# Patient Record
Sex: Male | Born: 1985
Health system: Southern US, Community
[De-identification: ages and names within clinical notes are randomized; demographics above are authoritative.]

## PROBLEM LIST (undated history)

## (undated) DIAGNOSIS — K219 Gastro-esophageal reflux disease without esophagitis: Secondary | ICD-10-CM

## (undated) DIAGNOSIS — A159 Respiratory tuberculosis unspecified: Secondary | ICD-10-CM

---

## 2007-12-22 HISTORY — PX: APPENDECTOMY: SHX54

## 2008-01-12 ENCOUNTER — Emergency Department (HOSPITAL_COMMUNITY): Admission: EM | Admit: 2008-01-12 | Discharge: 2008-01-13 | Payer: Self-pay | Admitting: Emergency Medicine

## 2008-01-13 ENCOUNTER — Encounter (INDEPENDENT_AMBULATORY_CARE_PROVIDER_SITE_OTHER): Payer: Self-pay | Admitting: Surgery

## 2008-01-13 ENCOUNTER — Observation Stay (HOSPITAL_COMMUNITY): Admission: EM | Admit: 2008-01-13 | Discharge: 2008-01-14 | Payer: Self-pay | Admitting: Emergency Medicine

## 2008-09-07 IMAGING — US US SCROTUM
1 series · 14 of 25 positions shown · non-contrast
Comparison: None.

CLINICAL DATA: 21-year-old, scrotal pain.
 SCROTAL ULTRASOUND:

 DOPPLER ULTRASOUND OF THE TESTICLES:
TECHNIQUE: Complete ultrasound examination of the testicles, epididymis, and other scrotal structures was performed.  Color and spectral Doppler ultrasound were also utilized to evaluate blood flow to the testicles.

[Series 1: unknown · 0.07mm/px · 14 of 53 slices shown]
[im 1/53]
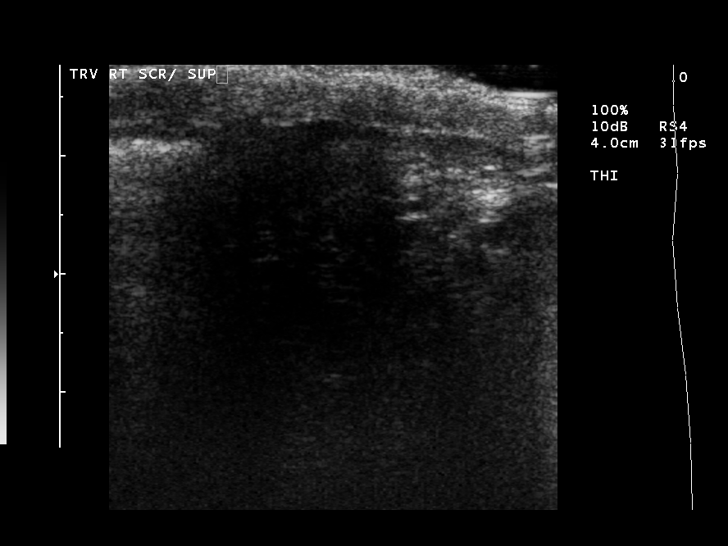
[im 5/53]
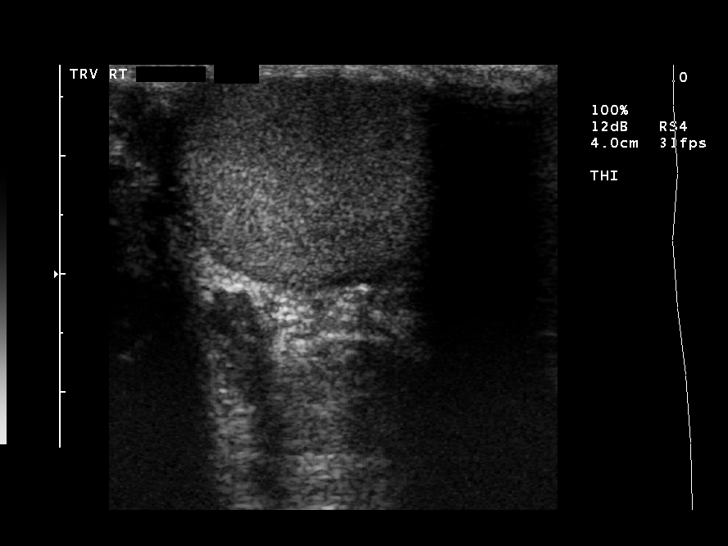
[im 9/53]
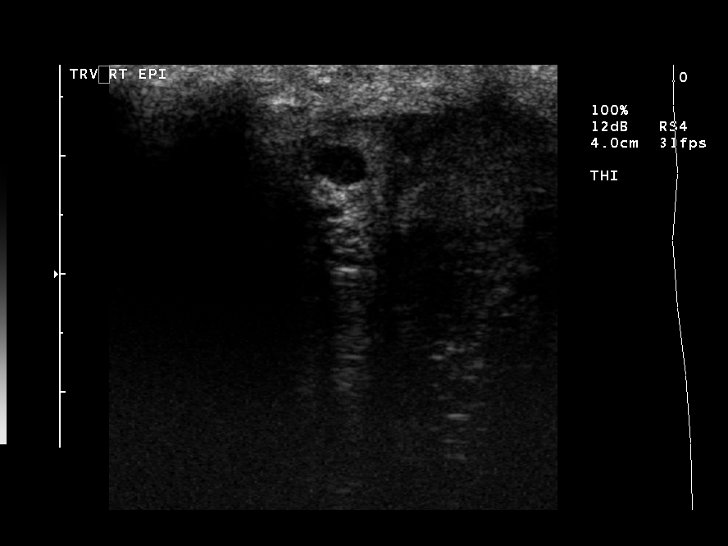
[im 14/53]
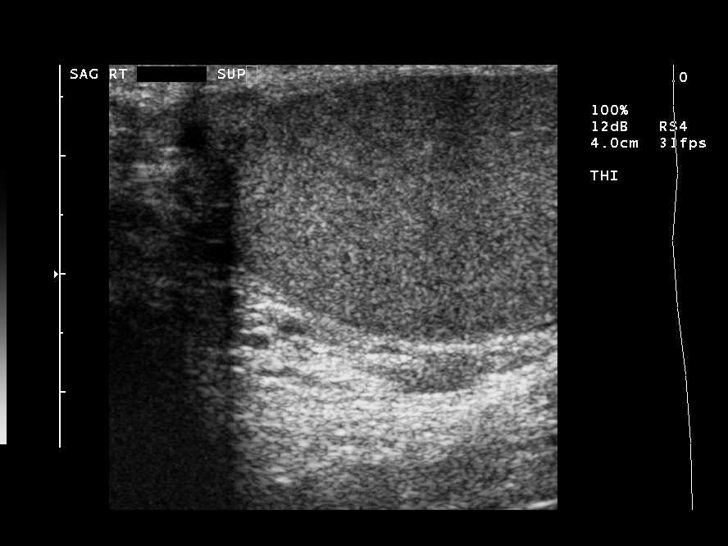
[im 18/53]
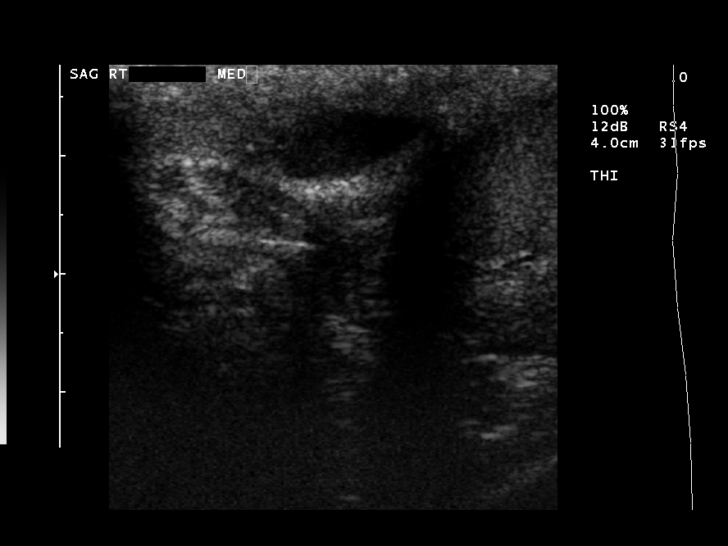
[im 20/53]
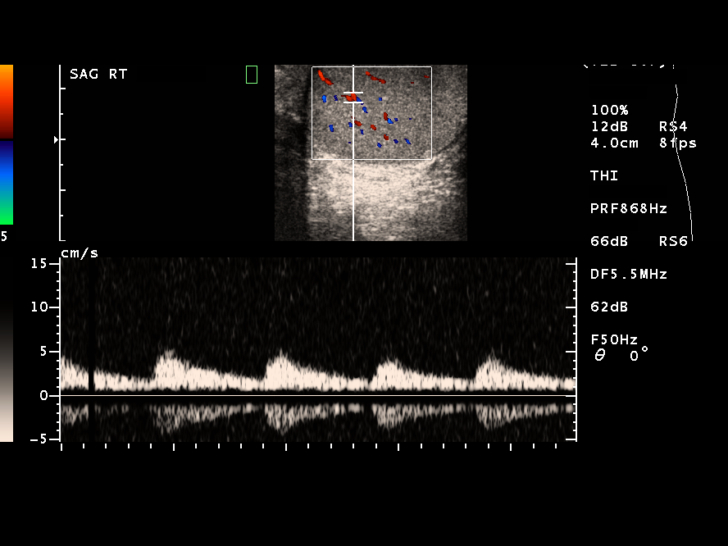
[im 24/53]
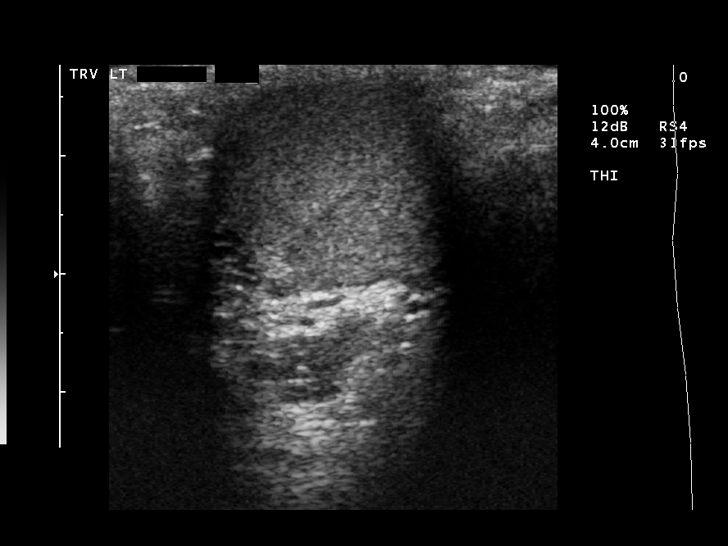
[im 29/53]
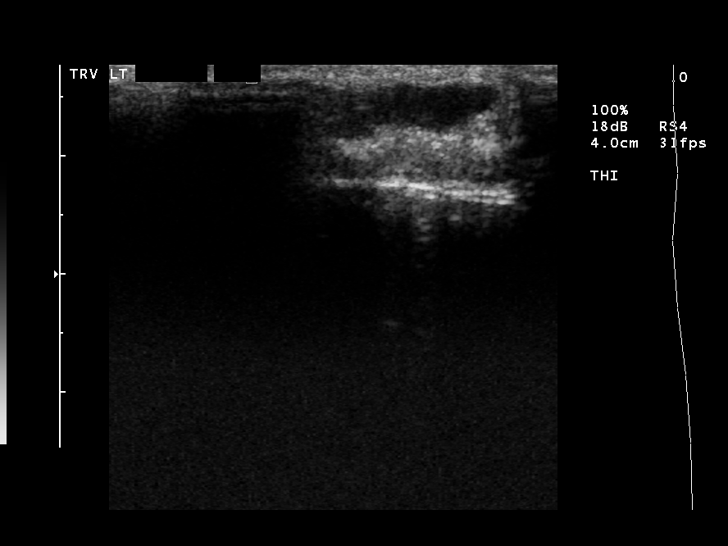
[im 33/53]
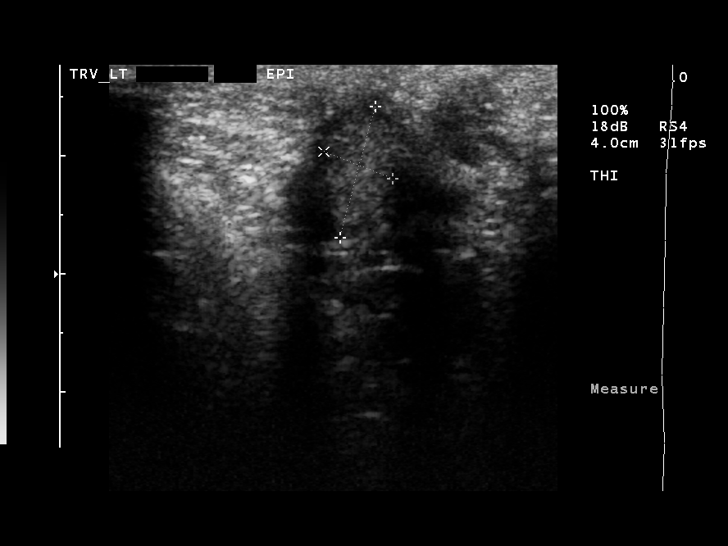
[im 35/53]
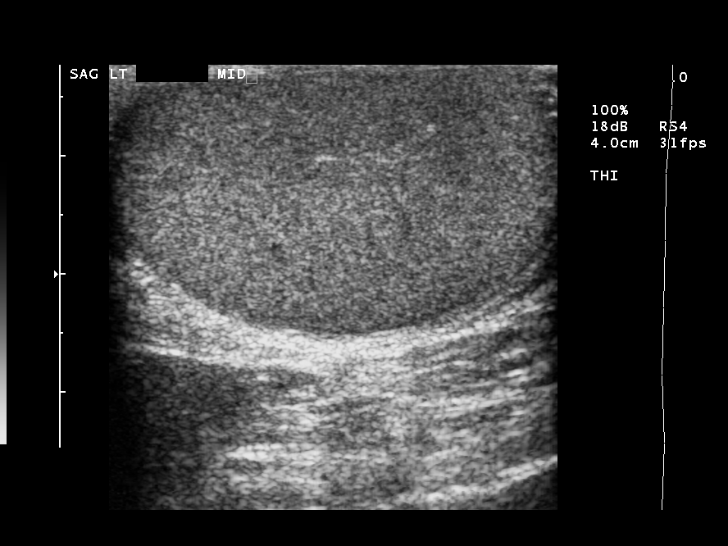
[im 40/53]
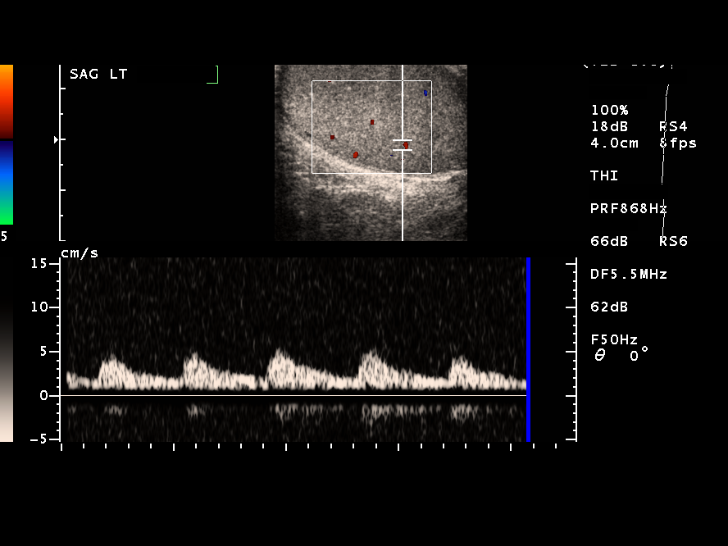
[im 44/53]
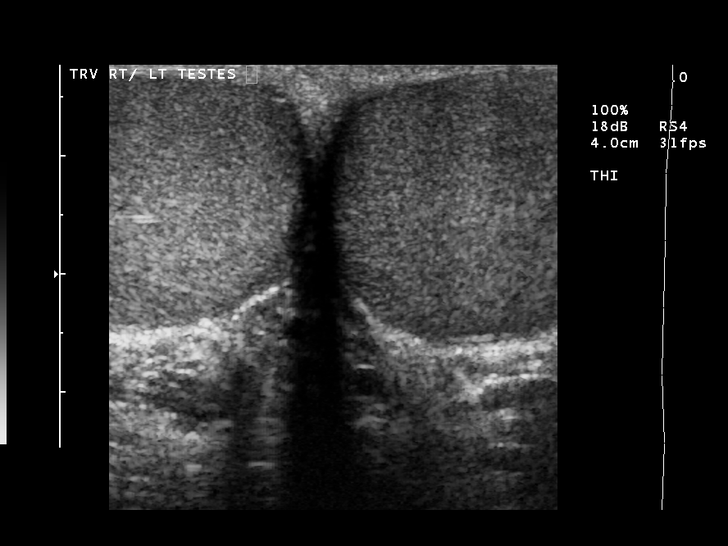
[im 48/53]
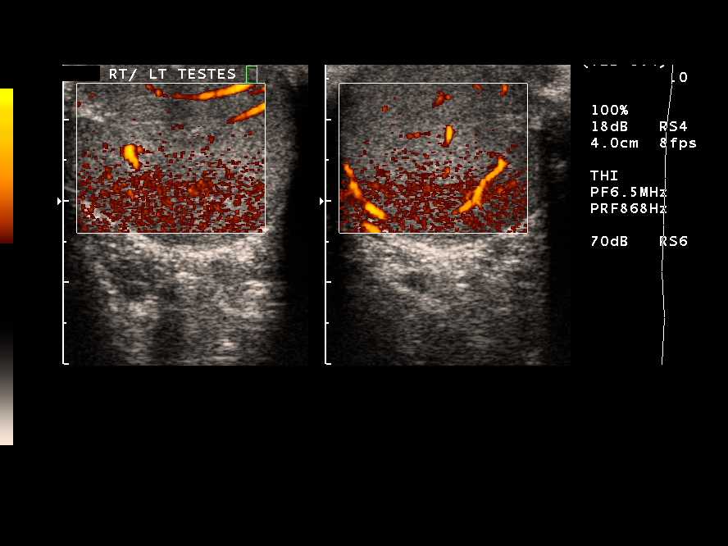
[im 53/53]
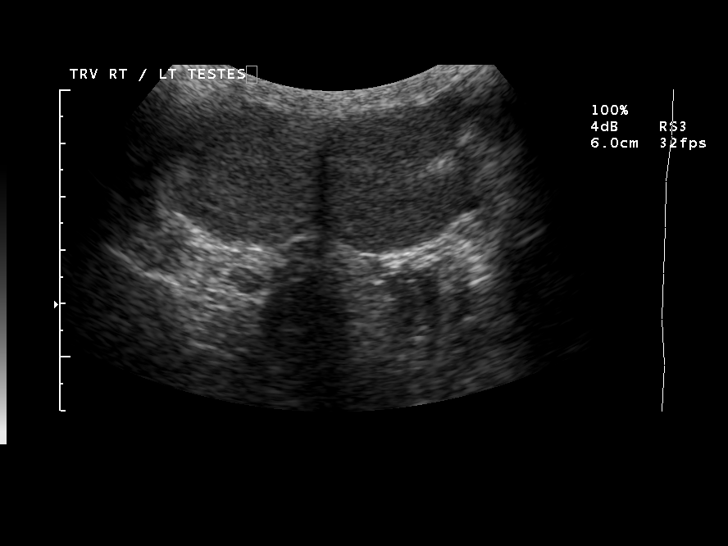

[14 of 25 positions shown; findings below may reference images not displayed]

FINDINGS: Right testicle measures 4.7 x 2.5 x 2.4 cm.  Left testicle measures 4.3 x 2.7 x 2.7 cm.  Both testicles demonstrate normal echogenicity without focal masses.  There is patent intratesticular blood flow bilaterally.  Arterial waveforms are documented.  
 Small bilateral hydroceles.  Small right epididymal cyst is noted.  No varicocele.
IMPRESSION: Unremarkable scrotal ultrasound examination.

## 2009-12-21 DIAGNOSIS — A159 Respiratory tuberculosis unspecified: Secondary | ICD-10-CM

## 2009-12-21 HISTORY — DX: Respiratory tuberculosis unspecified: A15.9

## 2010-03-14 ENCOUNTER — Encounter: Admission: RE | Admit: 2010-03-14 | Discharge: 2010-03-14 | Payer: Self-pay | Admitting: Infectious Diseases

## 2010-12-26 ENCOUNTER — Emergency Department (HOSPITAL_COMMUNITY)
Admission: EM | Admit: 2010-12-26 | Discharge: 2010-12-26 | Payer: Self-pay | Source: Home / Self Care | Admitting: Family Medicine

## 2010-12-26 LAB — POCT URINALYSIS DIPSTICK
Bilirubin Urine: NEGATIVE
Nitrite: NEGATIVE
Protein, ur: 30 mg/dL — AB
Specific Gravity, Urine: 1.015 (ref 1.005–1.030)
Urine Glucose, Fasting: NEGATIVE mg/dL
Urobilinogen, UA: 1 mg/dL (ref 0.0–1.0)
pH: 7 (ref 5.0–8.0)

## 2010-12-26 LAB — CBC
HCT: 46 % (ref 39.0–52.0)
Hemoglobin: 15.4 g/dL (ref 13.0–17.0)
MCH: 29.6 pg (ref 26.0–34.0)
MCHC: 33.5 g/dL (ref 30.0–36.0)
MCV: 88.5 fL (ref 78.0–100.0)
Platelets: 265 10*3/uL (ref 150–400)
RBC: 5.2 MIL/uL (ref 4.22–5.81)
RDW: 13.7 % (ref 11.5–15.5)
WBC: 20.5 10*3/uL — ABNORMAL HIGH (ref 4.0–10.5)

## 2010-12-26 LAB — DIFFERENTIAL
Basophils Absolute: 0 10*3/uL (ref 0.0–0.1)
Basophils Relative: 0 % (ref 0–1)
Eosinophils Absolute: 0 10*3/uL (ref 0.0–0.7)
Eosinophils Relative: 0 % (ref 0–5)
Lymphocytes Relative: 6 % — ABNORMAL LOW (ref 12–46)
Lymphs Abs: 1.2 10*3/uL (ref 0.7–4.0)
Monocytes Absolute: 1.9 10*3/uL — ABNORMAL HIGH (ref 0.1–1.0)
Monocytes Relative: 9 % (ref 3–12)
Neutro Abs: 17.4 10*3/uL — ABNORMAL HIGH (ref 1.7–7.7)
Neutrophils Relative %: 85 % — ABNORMAL HIGH (ref 43–77)

## 2010-12-26 LAB — POCT RAPID STREP A (OFFICE): Streptococcus, Group A Screen (Direct): POSITIVE — AB

## 2010-12-28 ENCOUNTER — Emergency Department (HOSPITAL_COMMUNITY)
Admission: EM | Admit: 2010-12-28 | Discharge: 2010-12-28 | Payer: Self-pay | Source: Home / Self Care | Admitting: Family Medicine

## 2011-02-02 ENCOUNTER — Inpatient Hospital Stay (INDEPENDENT_AMBULATORY_CARE_PROVIDER_SITE_OTHER)
Admission: RE | Admit: 2011-02-02 | Discharge: 2011-02-02 | Disposition: A | Discharge status: Home / Self Care | Payer: Self-pay | Source: Ambulatory Visit | Attending: Family Medicine | Admitting: Family Medicine

## 2011-02-02 ENCOUNTER — Emergency Department (HOSPITAL_COMMUNITY)
Admission: EM | Admit: 2011-02-02 | Discharge: 2011-02-02 | Disposition: A | Discharge status: Home / Self Care | Payer: Self-pay | Attending: Emergency Medicine | Admitting: Emergency Medicine

## 2011-02-02 ENCOUNTER — Emergency Department (HOSPITAL_COMMUNITY): Payer: Self-pay

## 2011-02-02 DIAGNOSIS — N509 Disorder of male genital organs, unspecified: Secondary | ICD-10-CM | POA: Insufficient documentation

## 2011-02-02 DIAGNOSIS — N453 Epididymo-orchitis: Secondary | ICD-10-CM | POA: Insufficient documentation

## 2011-02-03 LAB — GC/CHLAMYDIA PROBE AMP, GENITAL
Chlamydia, DNA Probe: NEGATIVE
GC Probe Amp, Genital: NEGATIVE

## 2011-05-05 NOTE — Op Note (Signed)
George Nguyen, George Nguyen               ACCOUNT NO.:  [id number]   MEDICAL RECORD NO.:  [id number]          PATIENT TYPE:  INP   LOCATION:  2550                         FACILITY:  [facility]   PHYSICIAN:  Wilmon Arms. Corliss Skains, M.D. DATE OF BIRTH:  01/10/1986   DATE OF PROCEDURE:  01/13/2008  DATE OF DISCHARGE:                               OPERATIVE REPORT   PREOPERATIVE DIAGNOSIS:  Acute appendicitis   POSTOPERATIVE DIAGNOSIS:  Acute on chronic appendicitis.   PROCEDURE:  Laparoscopic appendectomy.   SURGEON:  Wilmon Arms. Corliss Skains, M.D., FACS   ANESTHESIA:  General.   INDICATIONS:  The patient is a 25 year old male who presents with a 2-  day history of severe right lower quadrant pain.  He has also had some  hematuria.  Workup in the emergency department showed that the patient  has a 2 mm right ureteral stone with some mild hydronephrosis and  hydroureter.  However the patient also has a thickened appendix with a  large appendicolith.  The patient was initially sent home with  outpatient management of his ureteral stone. However, he returned to the  emergency department as the pain became more severe in his right lower  quadrant.  He has also had some nausea and vomiting.  The decision was  made to proceed with an appendectomy and urology has been consulted and  they are choosing to manage this stone nonoperatively for now.   DESCRIPTION OF PROCEDURE:  The patient is brought to the operating room,  placed in the supine position on the operating room table.  After an  adequate level of general anesthesia was obtained, the Foley catheter  was placed under sterile technique.  The patient's abdomen was shaved,  prepped with Betadine and draped in sterile fashion.  A time-out was  taken to assure the proper patient and proper procedure.  The area below  his umbilicus was infiltrated with 0.25% Marcaine.  A transverse  incision was made and dissection was carried down to the fascia.  The  fascia  was opened vertically.  The peritoneal cavity was bluntly  entered.  A stay suture of #0 Vicryl as placed around the fascial  opening.  The Hasson cannula was inserted and secured with a stay  suture.  Pneumoperitoneum was obtained by insufflating CO2 maintaining  maximal pressure of 15 mmHg.  The laparoscope was then inserted and  the  patient was positioned in Trendelenburg tilted to his left.  A 5-mm port  was placed in the right upper quadrant, another 5 mm port in the left  lower quadrant.  The scope was changed to a 5 mm 30 degree scope.  We  use Glassman clamps to mobilize the cecum medially.  There were a lot of  chronic adhesions in the right lower quadrant.  These were slowly taken  down with scissors and cautery.  The cecum was then mobilized medially.  The very tip of a thickened but non inflamed appendix was identified.  This was grasped.  It then became obvious that the appendix was quite  long but had a lot of retroperitoneal adhesions.  These were then slowly  taken down with blunt dissection as well as the harmonic scalpel.  We  continue mobilizing the appendix.  The mesoappendix was divided with the  harmonic scalpel.  Once we had identified the base of the appendix where  it joined the cecum, this was then divided with an Endo-GIA stapler.  The appendix was then placed in an EndoCatch sac and removed through the  umbilical port site.  We inspected the staple line and no leak was  noted.  No bleeding was noted.  We thoroughly irrigated the right lower  quadrant and suctioned it as dry as possible.  Pneumoperitoneum was then  released. Trocars were removed.  The stay suture was used to close the  fascia.  Deep 4-0 Monocryl was used to close the dermis.  Dermabond was  used for the skin.  The patient was then extubated and brought to  recovery in stable condition.  All sponge, instrument, and needle counts  were correct.      Wilmon Arms. Tsuei, M.D.  Electronically  Signed     MKT/MEDQ  D:  01/13/2008  T:  01/13/2008  Job:  [id number]

## 2011-05-05 NOTE — Discharge Summary (Signed)
NAMECADEL, STAIRS               ACCOUNT NO.:  [id number]   MEDICAL RECORD NO.:  [id number]          PATIENT TYPE:  OBV   LOCATION:  5120                         FACILITY:  [facility]   PHYSICIAN:  Wilmon Arms. Corliss Skains, M.D. DATE OF BIRTH:  [birth year]   DATE OF ADMISSION:  01/13/2008  DATE OF DISCHARGE:  01/14/2008                               DISCHARGE SUMMARY   ADMISSION DIAGNOSES:  1. Right obstructing ureteral calculus.  2. Nonobstructing left renal calculus.  3. Acute on chronic appendicitis.   DISCHARGE DIAGNOSES:  Same.   PROCEDURE:  Laparoscopic appendectomy.   HISTORY OF PRESENT ILLNESS:  The patient is a 25 year old male who  presents with a 2-day history of a worsening right lower quadrant and  right flank pain.  He underwent workup in the emergency department which  showed a minimally elevated white count.  He was noted to have a 2 mm  right ureteral stone with some mild hydronephrosis and hydroureter.  He  has also had some hematuria.  His appendix appeared thickened and had a  large appendicolith.  He was initially discharged home with outpatient  treatment for his ureteral stone.  However, his pain worsened and he  returned.  Surgery was then consulted regarding his appendix.   HOSPITAL COURSE:  The patient was admitted to the hospital and was  brought to the operating room where he underwent a laparoscopic  appendectomy.  The appendix of was thickened but did not appear actively  infected.  Most likely the patient has had some chronic appendicitis and  had a recurrence of his symptoms.  Today the patient feels better.  He  is very hungry and has been eating quite well.  He still has a little  bit of soreness around his incisions as well as on the right side.  His  urination has improved.  He does not see any blood.  He had some burning  after the Foley catheter was removed postop.   DISCHARGE INSTRUCTIONS:  The patient has been given a prescription for  Flomax.  He  has also been given a prescription for Percocet.  The Flomax  was given by the emergency department.  He is to follow with Dr. Ezzie Dural at [facility] in 1-2 weeks.  Follow up Dr. Corliss Skains in 3-4  weeks.  He may shower.  Regular diet.  He should try to strain his urine  to catch any ureteral stone.      Wilmon Arms. Tsuei, M.D.  Electronically Signed     MKT/MEDQ  D:  01/14/2008  T:  01/15/2008  Job:  [id number]

## 2011-05-05 NOTE — H&P (Signed)
[name], [name]               ACCOUNT NO.:  [id number]   MEDICAL RECORD NO.:  [id number]          PATIENT TYPE:  INP   LOCATION:  2550                         FACILITY:  [facility]   PHYSICIAN:  Wilmon Arms. Corliss Skains, M.D. DATE OF BIRTH:  09/07/1986   DATE OF ADMISSION:  01/13/2008  DATE OF DISCHARGE:                              HISTORY & PHYSICAL   CHIEF COMPLAINT:  Right lower quadrant pain.   HISTORY OF PRESENT ILLNESS:  The patient is a 25 year old male who  presents with a 2-day history of right lower quadrant pain radiating  down into his groin and into his testicle; the pain also radiates around  to his right flank.  He was initially evaluated last night in the  emergency department.  A testicular ultrasound was normal and ruled out  testicular torsion.  His white count was mildly elevated at 11.  A  noncontrasted CT scan was obtained due to hematuria.  The patient has  mild right hydronephrosis and hydroureter with a 2-mm pelvic ureteral  stone.  However, he also has a large appendicolith with a thickened  appendix.  On this noncontrasted CT scan, there was not really a lot of  inflammation around the appendix, but it clearly appears thickened.  There is no sign of abscess or perforation.  No free air.  The patient  was discharged home last night with outpatient treatment for his  ureteral stone.  However, after getting home, he began having more  severe right lower quadrant abdominal pain; the patient returned to the  emergency department for reevaluation.  The white count was repeated,  which is now normal.  However, due to his pain and onset of nausea and  vomiting, we were consulted regarding his appendix.   PAST MEDICAL HISTORY:  None.   PAST SURGICAL HISTORY:  None.   FAMILY HISTORY:  Diabetes in his mother.   SOCIAL HISTORY:  Nonsmoker, nondrinker.   ALLERGIES:  None.   MEDICATIONS:  He was given p.r.n. Percocet as well as Flomax, but it is  not clear whether he  is actually taking any yet, other than the  Percocet.   PHYSICAL EXAMINATION:  VITAL SIGNS:  Temperature 99.1, pulse 82,  respirations 20, blood pressure 131/81.  GENERAL: This is a well-developed, well-nourished male in no apparent  distress.  HEENT:  EOMI.  Sclerae anicteric.  NECK:  No mass, no thyromegaly.  LUNGS:  Clear.  Normal respiratory effort.  HEART:  Regular rate and rhythm.  No murmur.  ABDOMEN:  Positive bowel sounds.  Soft.  Tender in the right lower  quadrant, no real tenderness in his right groin or in his right flank.  There is some minimal guarding in the right lower quadrant.  No rebound.  EXTREMITIES:  No edema.  SKIN:  Warm and dry with no sign of jaundice.   LABORATORY DATA:  White count 9.8 and was 11.0 last night, hemoglobin  15.2.  Electrolytes within normal limits.   IMAGING STUDY:  CT scan shows a nonobstructing left renal calculus, mild  right hydronephrosis and hydroureter with a 2-mm pelvic ureteral  stone  and large appendicolith with appendiceal wall thickening.   IMPRESSION:  1. Acute on chronic appendicitis.  2. Obstructing right ureteral stone.   PLAN:  1. From a general surgical standpoint, I recommend a laparoscopic      appendectomy.  2. We will also consult Dr. Chana Bode Nesi from Urology regarding his      right ureteral stone.  Upon further discussion with the patient,      the patient states that he has had intermittent right lower      quadrant pain for the last couple of years.  He may have some      element of chronic appendicitis, but his symptoms seem worse this      time.  We discussed the procedure with the patient including      benefits and risks including bleeding and infection.  He      understands that he probably has 2 different processes going on at      the same time; his symptoms seemed to be more consistent with      appendicitis at this time, however.      Wilmon Arms. Tsuei, M.D.  Electronically Signed      MKT/MEDQ  D:  01/13/2008  T:  01/13/2008  Job:  [id number]

## 2011-06-04 ENCOUNTER — Inpatient Hospital Stay (INDEPENDENT_AMBULATORY_CARE_PROVIDER_SITE_OTHER)
Admission: RE | Admit: 2011-06-04 | Discharge: 2011-06-04 | Disposition: A | Discharge status: Home / Self Care | Payer: 59 | Source: Ambulatory Visit | Attending: Family Medicine | Admitting: Family Medicine

## 2011-06-04 ENCOUNTER — Emergency Department (HOSPITAL_COMMUNITY)
Admission: EM | Admit: 2011-06-04 | Discharge: 2011-06-04 | Disposition: A | Discharge status: Home / Self Care | Payer: 59 | Attending: Emergency Medicine | Admitting: Emergency Medicine

## 2011-06-04 ENCOUNTER — Emergency Department (HOSPITAL_COMMUNITY): Payer: 59

## 2011-06-04 DIAGNOSIS — R110 Nausea: Secondary | ICD-10-CM | POA: Insufficient documentation

## 2011-06-04 DIAGNOSIS — R109 Unspecified abdominal pain: Secondary | ICD-10-CM | POA: Insufficient documentation

## 2011-06-04 LAB — POCT I-STAT, CHEM 8
BUN: 12 mg/dL (ref 6–23)
Calcium, Ion: 1.18 mmol/L (ref 1.12–1.32)
Chloride: 103 mEq/L (ref 96–112)
Creatinine, Ser: 0.8 mg/dL (ref 0.4–1.5)
Glucose, Bld: 106 mg/dL — ABNORMAL HIGH (ref 70–99)
HCT: 50 % (ref 39.0–52.0)
Hemoglobin: 17 g/dL (ref 13.0–17.0)
Potassium: 4 mEq/L (ref 3.5–5.1)
Sodium: 140 mEq/L (ref 135–145)
TCO2: 26 mmol/L (ref 0–100)

## 2011-06-04 LAB — POCT URINALYSIS DIP (DEVICE)
Bilirubin Urine: NEGATIVE
Glucose, UA: NEGATIVE mg/dL
Hgb urine dipstick: NEGATIVE
Ketones, ur: NEGATIVE mg/dL
Leukocytes, UA: NEGATIVE
Nitrite: NEGATIVE
Protein, ur: NEGATIVE mg/dL
Specific Gravity, Urine: 1.02 (ref 1.005–1.030)
Urobilinogen, UA: 1 mg/dL (ref 0.0–1.0)
pH: 7 (ref 5.0–8.0)

## 2011-09-11 LAB — BASIC METABOLIC PANEL
BUN: 9
CO2: 28
Calcium: 9.1
Chloride: 106
Creatinine, Ser: 0.77
GFR calc Af Amer: >60
GFR calc non Af Amer: >60
Glucose, Bld: 117 — ABNORMAL HIGH
Potassium: 3.8
Sodium: 140

## 2011-09-11 LAB — CBC
HCT: 44.9
HCT: 47
Hemoglobin: 13.4
Hemoglobin: 15.2
Hemoglobin: 15.8
MCHC: 33.5
MCHC: 33.8
MCHC: 33.8
MCV: 87.5
MCV: 88.2
MCV: 88.4
Platelets: 291
Platelets: 328
RBC: 4.49
RBC: 5.14
RBC: 5.34
RDW: 13.5
RDW: 13.6
RDW: 13.7
WBC: 11 — ABNORMAL HIGH
WBC: 9.8

## 2011-09-11 LAB — I-STAT 8, (EC8 V) (CONVERTED LAB)
BUN: 14
Bicarbonate: 26 — ABNORMAL HIGH
Chloride: 107
Glucose, Bld: 111 — ABNORMAL HIGH
HCT: 52
Hemoglobin: 17.7 — ABNORMAL HIGH
Operator id: 196461
Potassium: 3.4 — ABNORMAL LOW
Sodium: 140
TCO2: 27
pCO2, Ven: 47
pH, Ven: 7.35 — ABNORMAL HIGH

## 2011-09-11 LAB — DIFFERENTIAL
Basophils Absolute: 0
Basophils Absolute: 0
Basophils Relative: 0
Basophils Relative: 0
Eosinophils Absolute: 0.1
Eosinophils Absolute: 0.2
Eosinophils Relative: 1
Eosinophils Relative: 1
Lymphocytes Relative: 16
Lymphocytes Relative: 30
Lymphs Abs: 1.5
Lymphs Abs: 3.3
Monocytes Absolute: 0.7
Monocytes Absolute: 0.8
Monocytes Relative: 7
Monocytes Relative: 7
Neutro Abs: 6.7
Neutro Abs: 7.5
Neutrophils Relative %: 61
Neutrophils Relative %: 76

## 2011-09-11 LAB — URINALYSIS, ROUTINE W REFLEX MICROSCOPIC
Bilirubin Urine: NEGATIVE
Glucose, UA: NEGATIVE
Ketones, ur: NEGATIVE
Leukocytes, UA: NEGATIVE
Nitrite: NEGATIVE
Protein, ur: 100 — AB
Specific Gravity, Urine: 1.028
Urobilinogen, UA: 1
pH: 6

## 2011-09-11 LAB — URINE MICROSCOPIC-ADD ON

## 2011-09-11 LAB — POCT I-STAT CREATININE
Creatinine, Ser: 0.9
Operator id: 196461

## 2012-05-04 ENCOUNTER — Emergency Department (HOSPITAL_COMMUNITY)
Admission: EM | Admit: 2012-05-04 | Discharge: 2012-05-04 | Disposition: A | Discharge status: Home / Self Care | Payer: 59 | Attending: Emergency Medicine | Admitting: Emergency Medicine

## 2012-05-04 ENCOUNTER — Encounter (HOSPITAL_COMMUNITY): Payer: Self-pay

## 2012-05-04 DIAGNOSIS — M549 Dorsalgia, unspecified: Secondary | ICD-10-CM | POA: Insufficient documentation

## 2012-05-04 DIAGNOSIS — X503XXA Overexertion from repetitive movements, initial encounter: Secondary | ICD-10-CM | POA: Insufficient documentation

## 2012-05-04 DIAGNOSIS — Y9269 Other specified industrial and construction area as the place of occurrence of the external cause: Secondary | ICD-10-CM | POA: Insufficient documentation

## 2012-05-04 DIAGNOSIS — S39012A Strain of muscle, fascia and tendon of lower back, initial encounter: Secondary | ICD-10-CM

## 2012-05-04 DIAGNOSIS — S335XXA Sprain of ligaments of lumbar spine, initial encounter: Secondary | ICD-10-CM | POA: Insufficient documentation

## 2012-05-04 MED ORDER — KETOROLAC TROMETHAMINE 60 MG/2ML IM SOLN
60.0000 mg | Freq: Once | INTRAMUSCULAR | Status: AC
  Administered 2012-05-04: 60 mg via INTRAMUSCULAR
  Filled 2012-05-04: qty 2

## 2012-05-04 MED ORDER — CYCLOBENZAPRINE HCL 10 MG PO TABS: 10.0000 mg | ORAL_TABLET | Freq: Three times a day (TID) | ORAL | Status: AC | PRN

## 2012-05-04 MED ORDER — HYDROCODONE-ACETAMINOPHEN 5-325 MG PO TABS: 1.0000 | ORAL_TABLET | Freq: Four times a day (QID) | ORAL | Status: AC | PRN

## 2012-05-04 MED ORDER — IBUPROFEN 800 MG PO TABS
800.0000 mg | ORAL_TABLET | Freq: Once | ORAL | Status: AC
  Administered 2012-05-04: 800 mg via ORAL
  Filled 2012-05-04: qty 1

## 2012-05-04 MED ORDER — IBUPROFEN 800 MG PO TABS: 800.0000 mg | ORAL_TABLET | Freq: Three times a day (TID) | ORAL | Status: AC | PRN

## 2012-05-04 NOTE — ED Provider Notes (Signed)
History     CSN: [id number]  Arrival date & time 05/04/12  0414   First MD Initiated Contact with Patient 05/04/12 0431      Chief Complaint  Patient presents with  . Back Pain    (Consider location/radiation/quality/duration/timing/severity/associated sxs/prior treatment) HPI Patient presents emergency Dept. with lower back pain began last night after arriving home from work.  He, states that he does do some lifting at work.  He was lifting some boxes and he thinks that may have caused this injury.  Patient states that he has not have any weakness or numbness, nausea, vomiting, abdominal pain, fever, chest pain, or shortness of breath.  Patient, states he is not having difficulty walking, or incontinence of bowel or bladder.  Patient, states that he tried ice on his lower back with some relief.  Patient, states that movement seemed to make the pain, worse. History reviewed. No pertinent past medical history.  History reviewed. No pertinent past surgical history.  History reviewed. No pertinent family history.  History  Substance Use Topics  . Smoking status: Not on file  . Smokeless tobacco: Not on file  . Alcohol Use: No      Review of Systems All other systems negative except as documented in the HPI. All pertinent positives and negatives as reviewed in the HPI.  Allergies  Review of patient's allergies indicates no known allergies.  Home Medications  No current outpatient prescriptions on file.  Pulse 70  Temp(Src) 98.1 F (36.7 C) (Oral)  Resp 18  SpO2 100%  Physical Exam Physical Examination: General appearance - alert, well appearing, and in no distress, oriented to person, place, and time and normal appearing weight Mental status - alert, oriented to person, place, and time Chest - clear to auscultation, no wheezes, rales or rhonchi, symmetric air entry Heart - normal rate, regular rhythm, normal S1, S2, no murmurs, rubs, clicks or gallops Back exam - full  range of motion,  palpable spasm or pain on motion, at this point.  Patient does not have any palpable pain is here taken ibuprofen 800 prior to my exam. Neurological - alert, oriented, normal speech, no focal findings or movement disorder noted, DTR's normal and symmetric, motor and sensory grossly normal bilaterally, normal muscle tone, no tremors, strength 5/5  ED Course  Procedures (including critical care time)  Patient will be treated for lumbar strain based on his history of present illness and physical exam findings.  He is told to return here for any worsening in his condition, advised to use ice and heat on his lower back.  Patient be given a work note for 2 days.  Patient is given the plan.  All questions were answered  MDM  See above comments        Carlyle Dolly, PA-C 05/04/12 0448

## 2012-05-04 NOTE — ED Notes (Signed)
Patient discharge via ambulatory with steady gait. Respirations equal and unlabored. Skin warm and dry. No acute distress noted. 

## 2012-05-04 NOTE — ED Notes (Signed)
Pt complains of lower back pain since last night, no injury noted, but has been picking up boxes

## 2012-05-04 NOTE — Discharge Instructions (Signed)
Use ice and heat on your lower back.  Return here for any worsening in your condition.

## 2012-05-05 NOTE — ED Provider Notes (Signed)
Medical screening examination/treatment/procedure(s) were performed by non-physician practitioner and as supervising physician I was immediately available for consultation/collaboration.  Sunnie Nielsen, MD 05/05/12 [phone]

## 2012-08-10 ENCOUNTER — Telehealth (INDEPENDENT_AMBULATORY_CARE_PROVIDER_SITE_OTHER): Payer: Self-pay | Admitting: General Surgery

## 2012-08-10 NOTE — Telephone Encounter (Signed)
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 I told pt that I needed to talk to you about this first and go from there. Pt call back number is [phone] cell

## 2012-08-26 ENCOUNTER — Ambulatory Visit (INDEPENDENT_AMBULATORY_CARE_PROVIDER_SITE_OTHER): Payer: 59 | Admitting: Surgery

## 2012-08-26 ENCOUNTER — Encounter (INDEPENDENT_AMBULATORY_CARE_PROVIDER_SITE_OTHER): Payer: Self-pay | Admitting: Surgery

## 2012-08-26 VITALS — BP 123/79 | HR 85 | Temp 98.6°F | Resp 14 | Ht 66.0 in | Wt 147.6 lb

## 2012-08-26 DIAGNOSIS — K36 Other appendicitis: Secondary | ICD-10-CM

## 2012-08-26 NOTE — Progress Notes (Signed)
Status post left back appendectomy in January of 2009. His pathology showed benign appendix. The patient did not keep his postop visit. 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 He is having no problems related to his surgery. His incisions are all well-healed no sign of infection. No sign of hernia. I gave him a letter clearing him for full activity. Followup when necessary.  Wilmon Arms. Corliss Skains, MD, [facility]  08/26/2012 11:33 AM

## 2013-01-25 ENCOUNTER — Other Ambulatory Visit: Payer: Self-pay | Admitting: Family Medicine

## 2013-01-25 ENCOUNTER — Ambulatory Visit
Admission: RE | Admit: 2013-01-25 | Discharge: 2013-01-25 | Disposition: A | Discharge status: Home / Self Care | Payer: 59 | Source: Ambulatory Visit | Attending: Family Medicine | Admitting: Family Medicine

## 2013-01-25 DIAGNOSIS — R7611 Nonspecific reaction to tuberculin skin test without active tuberculosis: Secondary | ICD-10-CM

## 2013-12-30 ENCOUNTER — Encounter (HOSPITAL_COMMUNITY): Payer: Self-pay | Admitting: Emergency Medicine

## 2013-12-30 ENCOUNTER — Emergency Department (HOSPITAL_COMMUNITY)
Admission: EM | Admit: 2013-12-30 | Discharge: 2013-12-30 | Disposition: A | Discharge status: Home / Self Care | Payer: 59 | Source: Home / Self Care | Attending: Family Medicine | Admitting: Family Medicine

## 2013-12-30 DIAGNOSIS — J329 Chronic sinusitis, unspecified: Secondary | ICD-10-CM

## 2013-12-30 MED ORDER — IPRATROPIUM BROMIDE 0.06 % NA SOLN: 2.0000 | Freq: Four times a day (QID) | NASAL | Status: DC

## 2013-12-30 MED ORDER — AZITHROMYCIN 250 MG PO TABS: 250.0000 mg | ORAL_TABLET | Freq: Every day | ORAL | Status: DC

## 2013-12-30 MED ORDER — PREDNISONE 10 MG PO TABS: 30.0000 mg | ORAL_TABLET | Freq: Every day | ORAL | Status: DC

## 2013-12-30 MED ORDER — GUAIFENESIN-CODEINE 100-10 MG/5ML PO SOLN: 5.0000 mL | Freq: Every evening | ORAL | Status: DC | PRN

## 2013-12-30 NOTE — Discharge Instructions (Signed)
Thank you for coming in today. Take prednisone daily for 5 days. Use Atrovent nasal spray. Use codeine containing cough medication at bedtime as needed. If not getting better in for 5 days or getting worse after taking azithromycin antibiotics. Call or go to the emergency room if you get worse, have trouble breathing, have chest pains, or palpitations.   Sinusitis Sinusitis is redness, soreness, and swelling (inflammation) of the paranasal sinuses. Paranasal sinuses are air pockets within the bones of your face (beneath the eyes, the middle of the forehead, or above the eyes). In healthy paranasal sinuses, mucus is able to drain out, and air is able to circulate through them by way of your nose. However, when your paranasal sinuses are inflamed, mucus and air can become trapped. This can allow bacteria and other germs to grow and cause infection. Sinusitis can develop quickly and last only a short time (acute) or continue over a long period (chronic). Sinusitis that lasts for more than 12 weeks is considered chronic.  CAUSES  Causes of sinusitis include:  Allergies.  Structural abnormalities, such as displacement of the cartilage that separates your nostrils (deviated septum), which can decrease the air flow through your nose and sinuses and affect sinus drainage.  Functional abnormalities, such as when the small hairs (cilia) that line your sinuses and help remove mucus do not work properly or are not present. SYMPTOMS  Symptoms of acute and chronic sinusitis are the same. The primary symptoms are pain and pressure around the affected sinuses. Other symptoms include:  Upper toothache.  Earache.  Headache.  Bad breath.  Decreased sense of smell and taste.  A cough, which worsens when you are lying flat.  Fatigue.  Fever.  Thick drainage from your nose, which often is green and may contain pus (purulent).  Swelling and warmth over the affected sinuses. DIAGNOSIS  Your  caregiver will perform a physical exam. During the exam, your caregiver may:  Look in your nose for signs of abnormal growths in your nostrils (nasal polyps).  Tap over the affected sinus to check for signs of infection.  View the inside of your sinuses (endoscopy) with a special imaging device with a light attached (endoscope), which is inserted into your sinuses. If your caregiver suspects that you have chronic sinusitis, one or more of the following tests may be recommended:  Allergy tests.  Nasal culture A sample of mucus is taken from your nose and sent to a lab and screened for bacteria.  Nasal cytology A sample of mucus is taken from your nose and examined by your caregiver to determine if your sinusitis is related to an allergy. TREATMENT  Most cases of acute sinusitis are related to a viral infection and will resolve on their own within 10 days. Sometimes medicines are prescribed to help relieve symptoms (pain medicine, decongestants, nasal steroid sprays, or saline sprays).  However, for sinusitis related to a bacterial infection, your caregiver will prescribe antibiotic medicines. These are medicines that will help kill the bacteria causing the infection.  Rarely, sinusitis is caused by a fungal infection. In theses cases, your caregiver will prescribe antifungal medicine. For some cases of chronic sinusitis, surgery is needed. Generally, these are cases in which sinusitis recurs more than 3 times per year, despite other treatments. HOME CARE INSTRUCTIONS   Drink plenty of water. Water helps thin the mucus so your sinuses can drain more easily.  Use a humidifier.  Inhale steam 3 to 4 times a day (for example,  sit in the bathroom with the shower running).  Apply a warm, moist washcloth to your face 3 to 4 times a day, or as directed by your caregiver.  Use saline nasal sprays to help moisten and clean your sinuses.  Take over-the-counter or prescription medicines for pain,  discomfort, or fever only as directed by your caregiver. SEEK IMMEDIATE MEDICAL CARE IF:  You have increasing pain or severe headaches.  You have nausea, vomiting, or drowsiness.  You have swelling around your face.  You have vision problems.  You have a stiff neck.  You have difficulty breathing. MAKE SURE YOU:   Understand these instructions.  Will watch your condition.  Will get help right away if you are not doing well or get worse. Document Released: 12/07/2005 Document Revised: 02/29/2012 Document Reviewed: 12/22/2011 [facility] Patient Information 2014 [place], [place].

## 2013-12-30 NOTE — ED Notes (Signed)
Pt c/o cold sxs onset 4 days w/sxs that include: ST, dry cough, BA, chest d/c due to cough, HA, congestion, SOB, wheezing Denies: f/v/n/d... Taking advil w/no relief. Alert w/no signs of acute distress.

## 2013-12-30 NOTE — ED Provider Notes (Signed)
Ghassan Alene MiresGuevara is a 28 y.o. male who presents to Urgent Care today for one week of nasal congestion cough sore throat mild shortness of breath. Patient also noted some sinus congestion. He's tried Advil which has helped a little. 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 He feels well otherwise.  History reviewed. No pertinent past medical history. History  Substance Use Topics  . Smoking status: Former Smoker    Quit date: 12/27/2011  . Smokeless tobacco: Not on file  . Alcohol Use: Yes     Comment: rarely   ROS as above Medications reviewed. No current facility-administered medications for this encounter.   Current Outpatient Prescriptions  Medication Sig Dispense Refill  . azithromycin (ZITHROMAX) 250 MG tablet Take 1 tablet (250 mg total) by mouth daily. Take first 2 tablets together, then 1 every day until finished.  6 tablet  0  . guaiFENesin-codeine 100-10 MG/5ML syrup Take 5 mLs by mouth at bedtime as needed for cough.  120 mL  0  . ipratropium (ATROVENT) 0.06 % nasal spray Place 2 sprays into both nostrils 4 (four) times daily.  15 mL  1  . predniSONE (DELTASONE) 10 MG tablet Take 3 tablets (30 mg total) by mouth daily.  15 tablet  0    Exam:  BP 106/68  Pulse 78  Temp(Src) 99.4 F (37.4 C) (Oral)  Resp 14  SpO2 100% Gen: Well NAD HEENT: EOMI,  MMM posterior pharynx with cobblestoning. Tympanic membranes are normal appearing bilaterally. Lungs: Normal work of breathing. CTABL Heart: RRR no MRG Abd: NABS, Soft. NT, ND Exts: Non edematous BL  LE, warm and well perfused.    Assessment and Plan: 28 y.o. male with viral sinusitis and bronchitis. Plan to treat with prednisone, codeine containing cough medication, Atrovent nasal spray. If not improving we'll use azithromycin antibiotics. Followup with primary care provider.  Discussed warning signs or symptoms. Please see discharge instructions. Patient expresses understanding.    Rodolph BongEvan S  Ellsworth Waldschmidt, MD 12/30/13 1739

## 2014-06-20 ENCOUNTER — Other Ambulatory Visit (HOSPITAL_COMMUNITY): Payer: Self-pay | Admitting: Otolaryngology

## 2014-06-22 ENCOUNTER — Encounter (HOSPITAL_COMMUNITY): Payer: Self-pay | Admitting: Pharmacy Technician

## 2014-06-27 NOTE — H&P (Signed)
Assessment  History of Tonsillar calculus (474.8) (J35.8); Resolved: 20Nov2014. Tonsillar hypertrophy (474.11) (J35.1). Tonsillar calculus (474.8) (J35.8). Discussed  He still doesn't smoke, he has quit drinking soda. He continues to have the same symptoms including particle buildup in his tonsils. On exam, tonsils are 3+ enlarged with cryptic spaces but no visible debris. Oral cavity and oropharynx otherwise look clear and healthy. Indirect laryngoscopy appears to be completely normal. No palpable neck masses. Given the findings and the history, I think it is very likely that tonsil lithiasis is causing his symptoms and since nothing has helped so far, tonsillectomy may be the next step. Reason For Visit  Bumps in back of throat. Allergies  No Known Drug Allergies. Current Meds  No Reported Medications;; RPT. Active Problems  Esophageal reflux   (530.81) (K21.9) Tonsillar calculus   (474.8) (J35.8). PSH  Appendectomy Oral Surgery Tooth Extraction. Family Hx  No pertinent family history: Mother. Personal Hx  Former smoker [phone](V15.82) [phone](Z87.891); quit 7 or 8 months ago Never a smoker (Z78.9); November 20 he said he quit smoking about 7-8 months ago Never Drank Alcohol. ROS  Systemic: Not feeling tired (fatigue).  No fever, no night sweats, and no recent weight loss. Head: No headache. Eyes: No eye symptoms. Otolaryngeal: No hearing loss, no earache, no tinnitus, and no purulent nasal discharge.  No nasal passage blockage (stuffiness), no snoring, no sneezing, no hoarseness, and no sore throat. Cardiovascular: No chest pain or discomfort  and no palpitations. Pulmonary: No dyspnea, no cough, and no wheezing. Gastrointestinal: No dysphagia  and no heartburn.  No nausea, no abdominal pain, and no melena.  No diarrhea. Genitourinary: No dysuria. Endocrine: No muscle weakness. Musculoskeletal: No calf muscle cramps, no arthralgias, and no soft tissue swelling. Neurological: No dizziness, no  fainting, no tingling, and no numbness. Psychological: No anxiety  and no depression. Skin: No rash. Vital Signs   Recorded by Skolimowski,Sharon on 09 Nov 2013 04:24 PM BP:110/60,  Height: 5 ft 6 in, Weight: 152 lb , BMI: 24.5 kg/m2,  BMI Calculated: 24.53 ,  BSA Calculated: 1.78. Signature  Electronically signed by : Serena ColonelJefry  Perley Arthurs  M.D.; 11/09/2013 4:36 PM EST.

## 2014-07-02 NOTE — Pre-Procedure Instructions (Signed)
George Nguyen  07/02/2014   Your procedure is scheduled on:  Friday July 17 th at 1130 AM  Report to [facility] Admitting at 0930 AM.  Call this number if you have problems the morning of surgery: [phone]   Remember:   Do not eat food or drink liquids after midnight.   Take these medicines the morning of surgery with A SIP OF WATER: NONE   Do not wear jewelry.  Do not wear lotions, powders, or cologne. You may wear deodorant.             Men may shave face and neck.  Do not bring valuables to the hospital.  [facility] is not responsible for any belongings or valuables.               Contacts, dentures or bridgework may not be worn into surgery.  Leave suitcase in the car. After surgery it may be brought to your room.  For patients admitted to the hospital, discharge time is determined by your treatment team.               Patients discharged the day of surgery will not be allowed to drive home.    Special Instructions: [place] - Preparing for Surgery  Before surgery, you can play an important role.  Because skin is not sterile, your skin needs to be as free of germs as possible.  You can reduce the number of germs on you skin by washing with CHG (chlorahexidine gluconate) soap before surgery.  CHG is an antiseptic cleaner which kills germs and bonds with the skin to continue killing germs even after washing.  Please DO NOT use if you have an allergy to CHG or antibacterial soaps.  If your skin becomes reddened/irritated stop using the CHG and inform your nurse when you arrive at Short Stay.  Do not shave (including legs and underarms) for at least 48 hours prior to the first CHG shower.  You may shave your face.  Please follow these instructions carefully:   1.  Shower with CHG Soap the night before surgery and the                                morning of Surgery.  2.  If you choose to wash your hair, wash your hair first as usual with your       normal  shampoo.  3.  After you shampoo, rinse your hair and body thoroughly to remove the                      Shampoo.  4.  Use CHG as you would any other liquid soap.  You can apply chg directly       to the skin and wash gently with scrungie or a clean washcloth.  5.  Apply the CHG Soap to your body ONLY FROM THE NECK DOWN.        Do not use on open wounds or open sores.  Avoid contact with your eyes,       ears, mouth and genitals (private parts).  Wash genitals (private parts)       with your normal soap.  6.  Wash thoroughly, paying special attention to the area where your surgery        will be performed.  7.  Thoroughly rinse your body with warm water from the  neck down.  8.  DO NOT shower/wash with your normal soap after using and rinsing off       the CHG Soap.  9.  Pat yourself dry with a clean towel.            10.  Wear clean pajamas.            11.  Place clean sheets on your bed the night of your first shower and do not        sleep with pets.  Day of Surgery  Do not apply any lotions/deoderants the morning of surgery.  Please wear clean clothes to the hospital/surgery center.      Please read over the following fact sheets that you were given: Pain Booklet, Coughing and Deep Breathing and Surgical Site Infection Prevention

## 2014-07-03 ENCOUNTER — Encounter (HOSPITAL_COMMUNITY)
Admission: RE | Admit: 2014-07-03 | Discharge: 2014-07-03 | Disposition: A | Discharge status: Home / Self Care | Payer: 59 | Source: Ambulatory Visit | Attending: Otolaryngology | Admitting: Otolaryngology

## 2014-07-03 ENCOUNTER — Encounter (HOSPITAL_COMMUNITY): Payer: Self-pay

## 2014-07-03 ENCOUNTER — Encounter (HOSPITAL_COMMUNITY)
Admission: RE | Admit: 2014-07-03 | Discharge: 2014-07-03 | Disposition: A | Discharge status: Home / Self Care | Payer: 59 | Source: Ambulatory Visit | Attending: Anesthesiology | Admitting: Anesthesiology

## 2014-07-03 HISTORY — DX: Gastro-esophageal reflux disease without esophagitis: K21.9

## 2014-07-03 HISTORY — DX: Respiratory tuberculosis unspecified: A15.9

## 2014-07-03 LAB — CBC
HEMATOCRIT: 46.9 % (ref 39.0–52.0)
HEMOGLOBIN: 15.6 g/dL (ref 13.0–17.0)
MCH: 30.2 pg (ref 26.0–34.0)
MCHC: 33.3 g/dL (ref 30.0–36.0)
MCV: 90.7 fL (ref 78.0–100.0)
Platelets: 276 10*3/uL (ref 150–400)
RBC: 5.17 MIL/uL (ref 4.22–5.81)
RDW: 12.8 % (ref 11.5–15.5)
WBC: 6.8 10*3/uL (ref 4.0–10.5)

## 2014-07-05 MED ORDER — DEXAMETHASONE SODIUM PHOSPHATE 10 MG/ML IJ SOLN
10.0000 mg | INTRAMUSCULAR | Status: AC
  Administered 2014-07-06: 10 mg via INTRAVENOUS
  Filled 2014-07-05: qty 1

## 2014-07-06 ENCOUNTER — Ambulatory Visit (HOSPITAL_COMMUNITY)
Admission: RE | Admit: 2014-07-06 | Discharge: 2014-07-06 | Disposition: A | Discharge status: Home / Self Care | Payer: 59 | Source: Ambulatory Visit | Attending: Otolaryngology | Admitting: Otolaryngology

## 2014-07-06 ENCOUNTER — Encounter (HOSPITAL_COMMUNITY): Payer: Self-pay | Admitting: Anesthesiology

## 2014-07-06 ENCOUNTER — Ambulatory Visit (HOSPITAL_COMMUNITY): Payer: 59 | Admitting: Anesthesiology

## 2014-07-06 ENCOUNTER — Encounter (HOSPITAL_COMMUNITY)
Admission: RE | Disposition: A | Discharge status: Home / Self Care | Payer: Self-pay | Source: Ambulatory Visit | Attending: Otolaryngology

## 2014-07-06 ENCOUNTER — Encounter (HOSPITAL_COMMUNITY): Payer: 59 | Admitting: Anesthesiology

## 2014-07-06 DIAGNOSIS — Z01812 Encounter for preprocedural laboratory examination: Secondary | ICD-10-CM | POA: Insufficient documentation

## 2014-07-06 DIAGNOSIS — Z9089 Acquired absence of other organs: Secondary | ICD-10-CM

## 2014-07-06 DIAGNOSIS — Z8611 Personal history of tuberculosis: Secondary | ICD-10-CM | POA: Insufficient documentation

## 2014-07-06 DIAGNOSIS — J351 Hypertrophy of tonsils: Secondary | ICD-10-CM | POA: Insufficient documentation

## 2014-07-06 DIAGNOSIS — Z87891 Personal history of nicotine dependence: Secondary | ICD-10-CM | POA: Insufficient documentation

## 2014-07-06 DIAGNOSIS — K219 Gastro-esophageal reflux disease without esophagitis: Secondary | ICD-10-CM | POA: Insufficient documentation

## 2014-07-06 DIAGNOSIS — Z01818 Encounter for other preprocedural examination: Secondary | ICD-10-CM | POA: Insufficient documentation

## 2014-07-06 DIAGNOSIS — J039 Acute tonsillitis, unspecified: Secondary | ICD-10-CM

## 2014-07-06 HISTORY — PX: TONSILLECTOMY: SHX5217

## 2014-07-06 SURGERY — TONSILLECTOMY
Anesthesia: General | Site: Mouth | Laterality: Bilateral

## 2014-07-06 MED ORDER — ONDANSETRON HCL 4 MG/2ML IJ SOLN
INTRAMUSCULAR | Status: DC | PRN
  Administered 2014-07-06: 4 mg via INTRAVENOUS

## 2014-07-06 MED ORDER — SUCCINYLCHOLINE CHLORIDE 20 MG/ML IJ SOLN
INTRAMUSCULAR | Status: DC | PRN
  Administered 2014-07-06: 100 mg via INTRAVENOUS

## 2014-07-06 MED ORDER — OXYCODONE HCL 5 MG PO TABS: 5.0000 mg | ORAL_TABLET | Freq: Once | ORAL | Status: AC | PRN

## 2014-07-06 MED ORDER — HYDROMORPHONE HCL PF 1 MG/ML IJ SOLN
0.2500 mg | INTRAMUSCULAR | Status: DC | PRN
  Administered 2014-07-06: 0.25 mg via INTRAVENOUS

## 2014-07-06 MED ORDER — MIDAZOLAM HCL 2 MG/2ML IJ SOLN
INTRAMUSCULAR | Status: AC
  Filled 2014-07-06: qty 2

## 2014-07-06 MED ORDER — HYDROMORPHONE HCL PF 1 MG/ML IJ SOLN
INTRAMUSCULAR | Status: AC
  Filled 2014-07-06: qty 1

## 2014-07-06 MED ORDER — FENTANYL CITRATE 0.05 MG/ML IJ SOLN
INTRAMUSCULAR | Status: DC | PRN
  Administered 2014-07-06: 100 ug via INTRAVENOUS
  Administered 2014-07-06: 50 ug via INTRAVENOUS
  Administered 2014-07-06: 100 ug via INTRAVENOUS

## 2014-07-06 MED ORDER — LACTATED RINGERS IV SOLN
INTRAVENOUS | Status: DC | PRN
  Administered 2014-07-06: 11:00:00 via INTRAVENOUS

## 2014-07-06 MED ORDER — PROPOFOL 10 MG/ML IV BOLUS
INTRAVENOUS | Status: AC
  Filled 2014-07-06: qty 20

## 2014-07-06 MED ORDER — GLYCOPYRROLATE 0.2 MG/ML IJ SOLN
INTRAMUSCULAR | Status: DC | PRN
  Administered 2014-07-06: .8 mg via INTRAVENOUS

## 2014-07-06 MED ORDER — HYDROCODONE-ACETAMINOPHEN 7.5-325 MG/15ML PO SOLN: 15.0000 mL | Freq: Four times a day (QID) | ORAL | Status: DC | PRN

## 2014-07-06 MED ORDER — GLYCOPYRROLATE 0.2 MG/ML IJ SOLN
INTRAMUSCULAR | Status: AC
  Filled 2014-07-06: qty 4

## 2014-07-06 MED ORDER — 0.9 % SODIUM CHLORIDE (POUR BTL) OPTIME
TOPICAL | Status: DC | PRN
  Administered 2014-07-06: 1000 mL

## 2014-07-06 MED ORDER — FENTANYL CITRATE 0.05 MG/ML IJ SOLN
INTRAMUSCULAR | Status: AC
  Filled 2014-07-06: qty 5

## 2014-07-06 MED ORDER — MIDAZOLAM HCL 5 MG/5ML IJ SOLN
INTRAMUSCULAR | Status: DC | PRN
  Administered 2014-07-06: 2 mg via INTRAVENOUS

## 2014-07-06 MED ORDER — PROMETHAZINE HCL 25 MG/ML IJ SOLN
INTRAMUSCULAR | Status: AC
  Filled 2014-07-06: qty 1

## 2014-07-06 MED ORDER — OXYCODONE HCL 5 MG/5ML PO SOLN
ORAL | Status: AC
  Filled 2014-07-06: qty 5

## 2014-07-06 MED ORDER — OXYCODONE HCL 5 MG/5ML PO SOLN
5.0000 mg | Freq: Once | ORAL | Status: AC | PRN
  Administered 2014-07-06: 5 mg via ORAL

## 2014-07-06 MED ORDER — SUCCINYLCHOLINE CHLORIDE 20 MG/ML IJ SOLN
INTRAMUSCULAR | Status: AC
  Filled 2014-07-06: qty 1

## 2014-07-06 MED ORDER — PROPOFOL 10 MG/ML IV BOLUS
INTRAVENOUS | Status: DC | PRN
  Administered 2014-07-06: 200 mg via INTRAVENOUS

## 2014-07-06 MED ORDER — ROCURONIUM BROMIDE 50 MG/5ML IV SOLN
INTRAVENOUS | Status: AC
  Filled 2014-07-06: qty 1

## 2014-07-06 MED ORDER — DEXTROSE-NACL 5-0.9 % IV SOLN: INTRAVENOUS | Status: DC

## 2014-07-06 MED ORDER — LIDOCAINE HCL (CARDIAC) 20 MG/ML IV SOLN
INTRAVENOUS | Status: AC
  Filled 2014-07-06: qty 5

## 2014-07-06 MED ORDER — PROMETHAZINE HCL 25 MG/ML IJ SOLN
6.2500 mg | INTRAMUSCULAR | Status: DC | PRN
  Administered 2014-07-06: 6.25 mg via INTRAVENOUS

## 2014-07-06 MED ORDER — ROCURONIUM BROMIDE 100 MG/10ML IV SOLN
INTRAVENOUS | Status: DC | PRN
  Administered 2014-07-06: 40 mg via INTRAVENOUS

## 2014-07-06 MED ORDER — LIDOCAINE HCL (CARDIAC) 20 MG/ML IV SOLN
INTRAVENOUS | Status: DC | PRN
  Administered 2014-07-06: 90 mg via INTRAVENOUS

## 2014-07-06 MED ORDER — PROMETHAZINE HCL 25 MG RE SUPP: 25.0000 mg | Freq: Four times a day (QID) | RECTAL | Status: DC | PRN

## 2014-07-06 MED ORDER — LACTATED RINGERS IV SOLN
INTRAVENOUS | Status: DC
  Administered 2014-07-06: 50 mL/h via INTRAVENOUS

## 2014-07-06 MED ORDER — NEOSTIGMINE METHYLSULFATE 10 MG/10ML IV SOLN
INTRAVENOUS | Status: DC | PRN
  Administered 2014-07-06: 5 mg via INTRAVENOUS

## 2014-07-06 MED ORDER — ONDANSETRON HCL 4 MG/2ML IJ SOLN
INTRAMUSCULAR | Status: AC
  Filled 2014-07-06: qty 2

## 2014-07-06 SURGICAL SUPPLY — 30 items
CANISTER SUCTION 2500CC (MISCELLANEOUS) ×3 IMPLANT
CATH ROBINSON RED A/P 10FR (CATHETERS) ×3 IMPLANT
CLEANER TIP ELECTROSURG 2X2 (MISCELLANEOUS) ×3 IMPLANT
COAGULATOR SUCT 6 FR SWTCH (ELECTROSURGICAL) ×1
COAGULATOR SUCT SWTCH 10FR 6 (ELECTROSURGICAL) ×2 IMPLANT
ELECT COATED BLADE 2.86 ST (ELECTRODE) ×3 IMPLANT
ELECT REM PT RETURN 9FT ADLT (ELECTROSURGICAL) ×3
ELECTRODE REM PT RTRN 9FT ADLT (ELECTROSURGICAL) IMPLANT
GAUZE SPONGE 4X4 16PLY XRAY LF (GAUZE/BANDAGES/DRESSINGS) ×3 IMPLANT
GLOVE BIO SURGEON STRL SZ7 (GLOVE) ×2 IMPLANT
GLOVE BIOGEL PI IND STRL 7.0 (GLOVE) IMPLANT
GLOVE BIOGEL PI INDICATOR 7.0 (GLOVE) ×4
GLOVE ECLIPSE 7.5 STRL STRAW (GLOVE) ×3 IMPLANT
GLOVE SURG SS PI 7.0 STRL IVOR (GLOVE) ×2 IMPLANT
GOWN STRL REUS W/ TWL LRG LVL3 (GOWN DISPOSABLE) ×2 IMPLANT
GOWN STRL REUS W/TWL LRG LVL3 (GOWN DISPOSABLE) ×9
KIT BASIN OR (CUSTOM PROCEDURE TRAY) ×3 IMPLANT
KIT ROOM TURNOVER OR (KITS) ×3 IMPLANT
NS IRRIG 1000ML POUR BTL (IV SOLUTION) ×3 IMPLANT
PACK SURGICAL SETUP 50X90 (CUSTOM PROCEDURE TRAY) ×3 IMPLANT
PAD ARMBOARD 7.5X6 YLW CONV (MISCELLANEOUS) ×6 IMPLANT
PENCIL FOOT CONTROL (ELECTRODE) ×3 IMPLANT
SPECIMEN JAR SMALL (MISCELLANEOUS) ×2 IMPLANT
SPONGE TONSIL 1 RF SGL (DISPOSABLE) ×3 IMPLANT
SYR BULB 3OZ (MISCELLANEOUS) ×3 IMPLANT
TOWEL OR 17X24 6PK STRL BLUE (TOWEL DISPOSABLE) ×6 IMPLANT
TUBE CONNECTING 12'X1/4 (SUCTIONS) ×1
TUBE CONNECTING 12X1/4 (SUCTIONS) ×2 IMPLANT
TUBE SALEM SUMP 16 FR W/ARV (TUBING) ×2 IMPLANT
WATER STERILE IRR 1000ML POUR (IV SOLUTION) ×1 IMPLANT

## 2014-07-06 NOTE — Discharge Instructions (Signed)
Tonsillectomy, Care After  Refer to this sheet in the next few weeks. These instructions provide you with information on caring for yourself after your procedure. Your health care provider may also give you more specific instructions. Your treatment has been planned according to current medical practices, but problems sometimes occur. Call your health care provider if you have any problems or questions after your procedure.  WHAT TO EXPECT AFTER THE PROCEDURE  After your procedure, it is typical to have the following:   Your tongue will be numb, and your sense of taste will be reduced.   Your swallowing will be difficult and painful.   Your jaw may hurt or make a clicking noise when you yawn or chew.   Liquids that you drink may leak out of your nose.   Your voice may sound muffled.   The area at the middle of the roof of your mouth (uvula) may be very swollen.   You may have a constant cough and need to clear mucus and phlegm from your throat.  HOME CARE INSTRUCTIONS    Get proper rest, keeping your head elevated at all times.   Drink plenty of fluids. This reduces pain and hastens the healing process.   Only take over-the-counter or prescription medicines for pain, discomfort, or fever as directed by your health care provider. Do not take aspirin or nonsteroidal anti-inflammatory drugs, such as ibuprofen, for 2 weeks after surgery. These medicines increase the possibility of bleeding.   Soft and cold foods, such as gelatin, sherbet, ice cream, frozen ice pops, and cold drinks, are usually the easiest to eat. Several days after surgery, you will be able to eat more solid food.   Avoid mouthwashes and gargles.   Avoid contact with people who have upper respiratory infections, such as colds and sore throats.  SEEK MEDICAL CARE IF:    You have increasing pain that is not controlled with medicines.   You have a fever.   You have a rash.   You feel lightheaded or faint.   You are unable to swallow even  small amounts of liquid or saliva.   Your urine is becoming very dark.  SEEK IMMEDIATE MEDICAL CARE IF:    You have difficulty breathing.   You experience side effects or allergic reactions to medicines.   You bleed bright red blood from your throat, or you vomit bright red blood.  MAKE SURE YOU:   Understand these instructions.   Will watch your condition.   Will get help right away if you are not doing well or get worse.  Document Released: 10/07/2004 Document Revised: 12/12/2013 Document Reviewed: 07/04/2013  ExitCare Patient Information 2015 [employer]. This information is not intended to replace advice given to you by your health care provider. Make sure you discuss any questions you have with your health care provider.

## 2014-07-06 NOTE — Progress Notes (Signed)
Dr.Rosen wants pt to stay for 4 hrs prior to d/c home. On hold for short stay or phase 2

## 2014-07-06 NOTE — Anesthesia Procedure Notes (Signed)
Procedure Name: Intubation Date/Time: 07/06/2014 10:54 AM Performed by: Darcey NoraJAMES, Elowyn Raupp B Pre-anesthesia Checklist: Patient identified, Emergency Drugs available, Suction available, Patient being monitored and Timeout performed Patient Re-evaluated:Patient Re-evaluated prior to inductionOxygen Delivery Method: Circle system utilized Preoxygenation: Pre-oxygenation with 100% oxygen Intubation Type: IV induction Ventilation: Mask ventilation without difficulty Laryngoscope Size: Miller and 2 Grade View: Grade I Tube type: Oral Rae Tube size: 7.5 mm Number of attempts: 1 Airway Equipment and Method: Stylet Placement Confirmation: ETT inserted through vocal cords under direct vision,  breath sounds checked- equal and bilateral and positive ETCO2 Secured at: 22 cm Tube secured with: Tape Dental Injury: Teeth and Oropharynx as per pre-operative assessment

## 2014-07-06 NOTE — Anesthesia Postprocedure Evaluation (Signed)
  Anesthesia Post-op Note  Patient: George Nguyen  Procedure(s) Performed: Procedure(s): TONSILLECTOMY (Bilateral)  Patient Location: PACU  Anesthesia Type:General  Level of Consciousness: awake and sedated  Airway and Oxygen Therapy: Patient Spontanous Breathing  Post-op Pain: mild  Post-op Assessment: Post-op Vital signs reviewed  Post-op Vital Signs: stable  Last Vitals:  Filed Vitals:   07/06/14 1200  BP: 135/71  Pulse: 75  Temp: 36.8 C  Resp: 17    Complications: No apparent anesthesia complications

## 2014-07-06 NOTE — Op Note (Signed)
07/06/2014  11:18 AM  PATIENT:  George Nguyen  28 y.o. male  PRE-OPERATIVE DIAGNOSIS:  TONSILLAR HYPERTROPHY  POST-OPERATIVE DIAGNOSIS:  TONSILLAR HYPERTROPHY  PROCEDURE:  Procedure(s): TONSILLECTOMY  SURGEON:  Surgeon(s): Serena ColonelJefry Santa Abdelrahman, MD  ANESTHESIA:   General  COUNTS: Correct   DICTATION: The patient was taken to the operating room and placed on the operating table in the supine position. Following induction of general endotracheal anesthesia, the table was turned and the patient was draped in a standard fashion. A Crowe-Davis mouthgag was inserted into the oral cavity and used to retract the tongue and mandible, then attached to the Mayo stand.  The tonsillectomy was then performed using electrocautery dissection, carefully dissecting the avascular plane between the capsule and constrictor muscles. Cautery was used for completion of hemostasis. The tonsils were discarded. They were enlarged, and contained a loarge amount of cryptic debris.  The pharynx was irrigated with saline and suctioned. An oral gastric tube was used to aspirate the contents of the stomach. The patient was then awakened from anesthesia and transferred to PACU in stable condition.   PATIENT DISPOSITION:  To PACA, stable

## 2014-07-06 NOTE — Transfer of Care (Signed)
Immediate Anesthesia Transfer of Care Note  Patient: George Nguyen  Procedure(s) Performed: Procedure(s): TONSILLECTOMY (Bilateral)  Patient Location: PACU  Anesthesia Type:General  Level of Consciousness: awake, alert  and patient cooperative  Airway & Oxygen Therapy: Patient Spontanous Breathing and Patient connected to nasal cannula oxygen  Post-op Assessment: Report given to PACU RN and Post -op Vital signs reviewed and stable  Post vital signs: Reviewed and stable  Complications: No apparent anesthesia complications

## 2014-07-06 NOTE — Anesthesia Preprocedure Evaluation (Addendum)
Anesthesia Evaluation  Patient identified by MRN, date of birth, ID band Patient awake    Reviewed: Allergy & Precautions, H&P , NPO status , Patient's Chart, lab work & pertinent test results  Airway Mallampati: I TM Distance: >3 FB Neck ROM: Full    Dental  (+) Teeth Intact, Dental Advisory Given   Pulmonary former smoker,  Hx of TB breath sounds clear to auscultation        Cardiovascular Rhythm:Regular     Neuro/Psych    GI/Hepatic GERD-  ,  Endo/Other    Renal/GU      Musculoskeletal   Abdominal   Peds  Hematology   Anesthesia Other Findings   Reproductive/Obstetrics                         Anesthesia Physical Anesthesia Plan  ASA: I  Anesthesia Plan: General   Post-op Pain Management:    Induction: Intravenous  Airway Management Planned: Oral ETT  Additional Equipment:   Intra-op Plan:   Post-operative Plan: Extubation in OR  Informed Consent: I have reviewed the patients History and Physical, chart, labs and discussed the procedure including the risks, benefits and alternatives for the proposed anesthesia with the patient or authorized representative who has indicated his/her understanding and acceptance.   Dental advisory given  Plan Discussed with: Surgeon  Anesthesia Plan Comments:        Anesthesia Quick Evaluation

## 2014-07-06 NOTE — Interval H&P Note (Signed)
History and Physical Interval Note:  07/06/2014 10:30 AM  George Nguyen  has presented today for surgery, with the diagnosis of Hypertrophy of tonsils alone  The various methods of treatment have been discussed with the patient and family. After consideration of risks, benefits and other options for treatment, the patient has consented to  Procedure(s): TONSILLECTOMY (Bilateral) as a surgical intervention .  The patient's history has been reviewed, patient examined, no change in status, stable for surgery.  I have reviewed the patient's chart and labs.  Questions were answered to the patient's satisfaction.     Eloyce Bultman

## 2014-07-10 ENCOUNTER — Encounter (HOSPITAL_COMMUNITY): Payer: Self-pay | Admitting: Otolaryngology

## 2016-03-02 DIAGNOSIS — Z3009 Encounter for other general counseling and advice on contraception: Secondary | ICD-10-CM | POA: Diagnosis not present

## 2016-03-02 DIAGNOSIS — R3915 Urgency of urination: Secondary | ICD-10-CM | POA: Diagnosis not present

## 2016-03-02 DIAGNOSIS — R351 Nocturia: Secondary | ICD-10-CM | POA: Diagnosis not present

## 2016-03-02 DIAGNOSIS — Z0000 Encounter for general adult medical examination without abnormal findings: Secondary | ICD-10-CM | POA: Diagnosis not present

## 2016-03-02 DIAGNOSIS — N200 Calculus of kidney: Secondary | ICD-10-CM | POA: Diagnosis not present

## 2016-05-11 DIAGNOSIS — R351 Nocturia: Secondary | ICD-10-CM | POA: Diagnosis not present

## 2016-05-11 DIAGNOSIS — R3915 Urgency of urination: Secondary | ICD-10-CM | POA: Diagnosis not present

## 2016-05-11 DIAGNOSIS — Z0000 Encounter for general adult medical examination without abnormal findings: Secondary | ICD-10-CM | POA: Diagnosis not present

## 2016-05-11 DIAGNOSIS — Z3009 Encounter for other general counseling and advice on contraception: Secondary | ICD-10-CM | POA: Diagnosis not present

## 2016-05-11 MED FILL — TRAMADOL-APAP 37.5-325 TAB: 37.5-325 | 2 days supply | Qty: 12 | Fill #0

## 2016-05-11 MED FILL — diazePAM 10 MG TABS: 10 | 1 days supply | Qty: 2 | Fill #0

## 2016-05-11 MED FILL — CEPHALEXIN 500 MG CAPSULE: 500 | 1 days supply | Qty: 1 | Fill #0

## 2016-08-07 ENCOUNTER — Ambulatory Visit (INDEPENDENT_AMBULATORY_CARE_PROVIDER_SITE_OTHER): Payer: 59 | Admitting: Internal Medicine

## 2016-08-07 DIAGNOSIS — Z9189 Other specified personal risk factors, not elsewhere classified: Secondary | ICD-10-CM

## 2016-08-07 NOTE — Progress Notes (Signed)
  RFV: pre travel counseling Subjective:    Patient ID: George Nguyen, male    DOB: 12-24-1985, 30 y.o.   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 He is going on a 5 day trip to [place] with his significant other. Came for pre travel counseling. No hx of having hep a vaccine or infection  No Known Allergies Current Outpatient Prescriptions on File Prior to Visit  Medication Sig Dispense Refill  . HYDROcodone-acetaminophen (HYCET) 7.5-325 mg/15 ml solution Take 15 mLs by mouth 4 (four) times daily as needed for moderate pain. 480 mL 0  . promethazine (PHENERGAN) 25 MG suppository Place 1 suppository (25 mg total) rectally every 6 (six) hours as needed for nausea or vomiting. 12 suppository 0  . Vitamin D, Ergocalciferol, (DRISDOL) 50000 UNITS CAPS capsule Take 50,000 Units by mouth 2 (two) times a week. On Monday and Thursday.     No current facility-administered medications on file prior to visit.    Active Ambulatory Problems    Diagnosis Date Noted  . Chronic appendicitis 08/26/2012  . S/P tonsillectomy 07/06/2014   Resolved Ambulatory Problems    Diagnosis Date Noted  . No Resolved Ambulatory Problems   Past Medical History:  Diagnosis Date  . GERD (gastroesophageal reflux disease)   . Tuberculosis 2011     Review of Systems     Objective:   Physical Exam        Assessment & Plan:  - since they are leaving tomorrow, we will not have much utility for getting vaccine. Recommended to come 2 wk prior to travel at next trip  - will give rx for azithromycin to use if needed for traveler's diarrhea  - gave precautions for mosquito bite prevention to minimize risk of zika, dengue, chikungunya. No need more malaria prophylaxis

## 2016-11-25 DIAGNOSIS — Z302 Encounter for sterilization: Secondary | ICD-10-CM | POA: Diagnosis not present

## 2017-02-23 DIAGNOSIS — Z0100 Encounter for examination of eyes and vision without abnormal findings: Secondary | ICD-10-CM | POA: Diagnosis not present

## 2017-04-20 DIAGNOSIS — N50819 Testicular pain, unspecified: Secondary | ICD-10-CM | POA: Diagnosis not present

## 2017-05-07 ENCOUNTER — Encounter: Payer: 59 | Admitting: Internal Medicine

## 2017-07-08 DIAGNOSIS — E031 Congenital hypothyroidism without goiter: Secondary | ICD-10-CM | POA: Diagnosis not present

## 2017-07-13 DIAGNOSIS — K219 Gastro-esophageal reflux disease without esophagitis: Secondary | ICD-10-CM | POA: Diagnosis not present

## 2017-07-13 MED FILL — OMEPRAZOLE DR 40 MG CAPSULE: 40 | 30 days supply | Qty: 30 | Fill #0

## 2017-08-17 MED FILL — OMEPRAZOLE DR 40 MG CAPSULE: 40 | 30 days supply | Qty: 30 | Fill #1

## 2017-08-18 DIAGNOSIS — B360 Pityriasis versicolor: Secondary | ICD-10-CM | POA: Diagnosis not present

## 2017-08-18 DIAGNOSIS — L819 Disorder of pigmentation, unspecified: Secondary | ICD-10-CM | POA: Diagnosis not present

## 2017-08-18 MED FILL — OXICONAZOLE NITRATE 1% CRM: 1 | 30 days supply | Qty: 90 | Fill #0

## 2017-10-21 MED FILL — OMEPRAZOLE DR 40 MG CAPSULE: 40 | 30 days supply | Qty: 30 | Fill #2

## 2018-01-24 MED FILL — OMEPRAZOLE DR 40 MG CAPSULE: 40 | 90 days supply | Qty: 90 | Fill #0

## 2018-04-14 MED FILL — OLOPATADINE HCL 0.1% EYE DR: 0.1 | 25 days supply | Qty: 5 | Fill #0

## 2018-04-14 MED FILL — FLUTICASONE PROP 50 MCG SPR: 50 | 30 days supply | Qty: 16 | Fill #0

## 2018-06-16 MED FILL — OMEPRAZOLE DR 40 MG CAPSULE: 40 | 90 days supply | Qty: 90 | Fill #1

## 2018-11-29 MED FILL — OMEPRAZOLE 40 MG CPDR: 40 | 90 days supply | Qty: 90 | Fill #2

## 2018-12-15 MED FILL — TRIAMCINOLONE 0.1% OINTMEN: 0.1 | 14 days supply | Qty: 60 | Fill #0

## 2019-01-02 MED FILL — TRIAMCINOLONE 0.1% OINTMEN: 0.1 | 14 days supply | Qty: 60 | Fill #0

## 2019-01-13 MED FILL — TRIAMCINOLONE 0.1% OINTMENT: 0.1 | 14 days supply | Qty: 60 | Fill #0

## 2019-01-19 MED FILL — FLUCONAZOLE 200 MG TABLET: 200 | 21 days supply | Qty: 6 | Fill #0

## 2019-02-17 DIAGNOSIS — Z0000 Encounter for general adult medical examination without abnormal findings: Secondary | ICD-10-CM | POA: Diagnosis not present

## 2019-02-17 DIAGNOSIS — Z13228 Encounter for screening for other metabolic disorders: Secondary | ICD-10-CM | POA: Diagnosis not present

## 2019-03-27 DIAGNOSIS — J3089 Other allergic rhinitis: Secondary | ICD-10-CM | POA: Diagnosis not present

## 2019-03-28 MED FILL — FLUTICASONE PROP 50 MCG SPR: 50 | 30 days supply | Qty: 16 | Fill #0

## 2019-04-03 DIAGNOSIS — R05 Cough: Secondary | ICD-10-CM | POA: Diagnosis not present

## 2019-04-03 DIAGNOSIS — J3089 Other allergic rhinitis: Secondary | ICD-10-CM | POA: Diagnosis not present

## 2019-04-03 DIAGNOSIS — J301 Allergic rhinitis due to pollen: Secondary | ICD-10-CM | POA: Diagnosis not present

## 2019-04-03 DIAGNOSIS — J3081 Allergic rhinitis due to animal (cat) (dog) hair and dander: Secondary | ICD-10-CM | POA: Diagnosis not present

## 2019-04-03 MED FILL — AZELASTINE HCL 0.05% DROPS: 0.05 | 30 days supply | Qty: 6 | Fill #0

## 2019-04-03 MED FILL — MONTELUKAST SOD 10 MG TAB: 10 | 30 days supply | Qty: 30 | Fill #0

## 2019-04-03 MED FILL — LEVOCETIRIZINE 5 MG TABLET: 5 | 30 days supply | Qty: 30 | Fill #0

## 2019-04-24 MED FILL — OMEPRAZOLE 40 MG CPDR: 40 | 90 days supply | Qty: 90 | Fill #0

## 2019-05-03 MED FILL — LEVOCETIRIZINE 5 MG TABLET: 5 | 30 days supply | Qty: 30 | Fill #1

## 2019-05-03 MED FILL — MONTELUKAST SOD 10 MG TAB: 10 | 30 days supply | Qty: 30 | Fill #1

## 2019-05-18 DIAGNOSIS — E782 Mixed hyperlipidemia: Secondary | ICD-10-CM | POA: Diagnosis not present

## 2019-05-18 DIAGNOSIS — R3915 Urgency of urination: Secondary | ICD-10-CM | POA: Diagnosis not present

## 2019-05-18 DIAGNOSIS — R350 Frequency of micturition: Secondary | ICD-10-CM | POA: Diagnosis not present

## 2019-06-03 MED FILL — LEVOCETIRIZINE 5 MG TABLET: 5 | 30 days supply | Qty: 30 | Fill #2

## 2019-06-03 MED FILL — MONTELUKAST SOD 10 MG TAB: 10 | 30 days supply | Qty: 30 | Fill #2

## 2019-06-20 DIAGNOSIS — H5213 Myopia, bilateral: Secondary | ICD-10-CM | POA: Diagnosis not present

## 2019-06-20 DIAGNOSIS — H04123 Dry eye syndrome of bilateral lacrimal glands: Secondary | ICD-10-CM | POA: Diagnosis not present

## 2019-10-19 MED FILL — KETOCONAZOLE 2% CREAM: 2 | 45 days supply | Qty: 45 | Fill #0

## 2019-11-20 DIAGNOSIS — L719 Rosacea, unspecified: Secondary | ICD-10-CM | POA: Diagnosis not present

## 2019-11-20 DIAGNOSIS — B360 Pityriasis versicolor: Secondary | ICD-10-CM | POA: Diagnosis not present

## 2019-11-20 DIAGNOSIS — L700 Acne vulgaris: Secondary | ICD-10-CM | POA: Diagnosis not present

## 2020-02-28 DIAGNOSIS — M25561 Pain in right knee: Secondary | ICD-10-CM | POA: Diagnosis not present

## 2020-02-28 DIAGNOSIS — Z0000 Encounter for general adult medical examination without abnormal findings: Secondary | ICD-10-CM | POA: Diagnosis not present

## 2020-02-28 DIAGNOSIS — E559 Vitamin D deficiency, unspecified: Secondary | ICD-10-CM | POA: Diagnosis not present

## 2020-02-28 DIAGNOSIS — Z9109 Other allergy status, other than to drugs and biological substances: Secondary | ICD-10-CM | POA: Diagnosis not present

## 2020-02-28 DIAGNOSIS — E782 Mixed hyperlipidemia: Secondary | ICD-10-CM | POA: Diagnosis not present

## 2020-02-29 MED FILL — LEVOCETIRIZINE 5 MG TABLET: 5 | 30 days supply | Qty: 30 | Fill #3

## 2020-02-29 MED FILL — MONTELUKAST SOD 10 MG TAB: 10 | 30 days supply | Qty: 30 | Fill #3

## 2020-03-06 DIAGNOSIS — Z0000 Encounter for general adult medical examination without abnormal findings: Secondary | ICD-10-CM | POA: Diagnosis not present

## 2020-03-06 DIAGNOSIS — E782 Mixed hyperlipidemia: Secondary | ICD-10-CM | POA: Diagnosis not present

## 2020-03-06 DIAGNOSIS — M25561 Pain in right knee: Secondary | ICD-10-CM | POA: Diagnosis not present

## 2020-03-06 DIAGNOSIS — E559 Vitamin D deficiency, unspecified: Secondary | ICD-10-CM | POA: Diagnosis not present

## 2020-03-06 DIAGNOSIS — M25562 Pain in left knee: Secondary | ICD-10-CM | POA: Diagnosis not present

## 2020-04-16 DIAGNOSIS — J3081 Allergic rhinitis due to animal (cat) (dog) hair and dander: Secondary | ICD-10-CM | POA: Diagnosis not present

## 2020-04-16 DIAGNOSIS — H1045 Other chronic allergic conjunctivitis: Secondary | ICD-10-CM | POA: Diagnosis not present

## 2020-04-16 DIAGNOSIS — R05 Cough: Secondary | ICD-10-CM | POA: Diagnosis not present

## 2020-04-16 DIAGNOSIS — J301 Allergic rhinitis due to pollen: Secondary | ICD-10-CM | POA: Diagnosis not present

## 2020-04-16 MED FILL — LEVOCETIRIZINE 5 MG TABLET: 5 | 30 days supply | Qty: 30 | Fill #0

## 2020-04-16 MED FILL — AZELASTINE HCL 0.05 % SOLN: 0.05 | 30 days supply | Qty: 6 | Fill #0

## 2020-10-01 ENCOUNTER — Other Ambulatory Visit (HOSPITAL_COMMUNITY): Payer: Self-pay | Admitting: Family Medicine

## 2020-10-01 MED FILL — CYCLOBENZAPRINE HCL 10 MG T: 10 | 10 days supply | Qty: 30 | Fill #0

## 2020-10-03 ENCOUNTER — Other Ambulatory Visit (HOSPITAL_COMMUNITY): Payer: Self-pay | Admitting: Family Medicine

## 2020-10-03 ENCOUNTER — Ambulatory Visit (HOSPITAL_COMMUNITY)
Admission: EM | Admit: 2020-10-03 | Discharge: 2020-10-03 | Disposition: A | Discharge status: Home / Self Care | Payer: 59 | Attending: Family Medicine | Admitting: Family Medicine

## 2020-10-03 ENCOUNTER — Other Ambulatory Visit: Payer: Self-pay

## 2020-10-03 ENCOUNTER — Encounter (HOSPITAL_COMMUNITY): Payer: Self-pay | Admitting: *Deleted

## 2020-10-03 DIAGNOSIS — B029 Zoster without complications: Secondary | ICD-10-CM

## 2020-10-03 MED ORDER — VALACYCLOVIR HCL 1 G PO TABS: 1000.0000 mg | ORAL_TABLET | Freq: Three times a day (TID) | ORAL | 0 refills | Status: DC

## 2020-10-03 MED ORDER — IBUPROFEN 600 MG PO TABS: 600.0000 mg | ORAL_TABLET | Freq: Three times a day (TID) | ORAL | 0 refills | Status: DC | PRN

## 2020-10-03 MED ORDER — HYDROCODONE-ACETAMINOPHEN 5-325 MG PO TABS: 1.0000 | ORAL_TABLET | Freq: Four times a day (QID) | ORAL | 0 refills | Status: DC | PRN

## 2020-10-03 MED ORDER — ACETAMINOPHEN 500 MG PO TABS: 500.0000 mg | ORAL_TABLET | Freq: Four times a day (QID) | ORAL | 0 refills | Status: DC | PRN

## 2020-10-03 MED FILL — HYDROCODON-APAP 5-325: 5-325 | 2 days supply | Qty: 10 | Fill #0

## 2020-10-03 MED FILL — IBUPROFEN 600 MG TABLET: 600 | 10 days supply | Qty: 30 | Fill #0

## 2020-10-03 MED FILL — valACYclovir HCL 1 GM TABS: 1 | 7 days supply | Qty: 21 | Fill #0

## 2020-10-03 NOTE — ED Provider Notes (Signed)
[facility]    CSN: [id number] Arrival date & time: 10/03/20  0809      History   Chief Complaint Chief Complaint  Patient presents with  . Back Pain    upper lt    HPI George Nguyen is a 34 y.o. male.   Patient is a 34 year old male that presents today with left upper back pain radiating into left axilla area and chest area.  This is been present and worsening since Sunday.  Was seen Monday by his primary care and given steroid injection muscle accident which has not helped at all.  This morning noticed a rash to the area.  Describes the pain as sharp and stabbing and burning at times.  Pain 6 out of 10 at this time.  No fevers, chills, body aches or night sweats.     Past Medical History:  Diagnosis Date  . GERD (gastroesophageal reflux disease)    OTC meds  . Tuberculosis 2011   completed INH    Patient Active Problem List   Diagnosis Date Noted  . S/P tonsillectomy 07/06/2014  . Chronic appendicitis 08/26/2012    Past Surgical History:  Procedure Laterality Date  . APPENDECTOMY  2009  . TONSILLECTOMY Bilateral 07/06/2014   Procedure: TONSILLECTOMY;  Surgeon: Serena Colonel, MD;  [facility];  Service: ENT;  Laterality: Bilateral;       Home Medications    Prior to Admission medications   Medication Sig Start Date End Date Taking? Authorizing Provider  acetaminophen (TYLENOL) 500 MG tablet Take 1 tablet (500 mg total) by mouth every 6 (six) hours as needed. 10/03/20   Dahlia Byes A, NP  HYDROcodone-acetaminophen (NORCO/VICODIN) 5-325 MG tablet Take 1-2 tablets by mouth every 6 (six) hours as needed. 10/03/20   Dahlia Byes A, NP  ibuprofen (ADVIL) 600 MG tablet Take 1 tablet (600 mg total) by mouth every 8 (eight) hours as needed for moderate pain. 10/03/20   Dahlia Byes A, NP  valACYclovir (VALTREX) 1000 MG tablet Take 1 tablet (1,000 mg total) by mouth 3 (three) times daily. 10/03/20   Janace Aris, NP  promethazine (PHENERGAN) 25 MG  suppository Place 1 suppository (25 mg total) rectally every 6 (six) hours as needed for nausea or vomiting. 07/06/14 10/03/20  Serena Colonel, MD    Family History History reviewed. No pertinent family history.  Social History Social History   Tobacco Use  . Smoking status: Former Smoker    Quit date: 12/27/2011    Years since quitting: 8.7  . Smokeless tobacco: Never Used  Substance Use Topics  . Alcohol use: Yes    Comment: rarely  . Drug use: No     Allergies   Patient has no known allergies.   Review of Systems Review of Systems   Physical Exam Triage Vital Signs ED Triage Vitals  Enc Vitals Group     BP 10/03/20 0830 130/86     Pulse Rate 10/03/20 0830 100     Resp 10/03/20 0830 18     Temp 10/03/20 0830 98 F (36.7 C)     Temp Source 10/03/20 0830 Oral     SpO2 10/03/20 0830 100 %     Weight 10/03/20 0832 154 lb (69.9 kg)     Height 10/03/20 0832 5\' 6"  (1.676 m)     Head Circumference --      Peak Flow --      Pain Score 10/03/20 0832 6     Pain Loc --  Pain Edu? --      Excl. in GC? --    No data found.  Updated Vital Signs BP 130/86 (BP Location: Right Arm)   Pulse 100   Temp 98 F (36.7 C) (Oral)   Resp 18   Ht 5\' 6"  (1.676 m)   Wt 154 lb (69.9 kg)   SpO2 100%   BMI 24.86 kg/m   Visual Acuity Right Eye Distance:   Left Eye Distance:   Bilateral Distance:    Right Eye Near:   Left Eye Near:    Bilateral Near:     Physical Exam Vitals and nursing note reviewed.  Constitutional:      Appearance: Normal appearance.  HENT:     Head: Normocephalic and atraumatic.     Nose: Nose normal.  Eyes:     Conjunctiva/sclera: Conjunctivae normal.  Pulmonary:     Effort: Pulmonary effort is normal.  Musculoskeletal:        General: Normal range of motion.     Cervical back: Normal range of motion.  Skin:    General: Skin is warm and dry.     Findings: Rash present.     Comments: Erythematous papular rash to T3 and T4 dermatome 2 left  side  Neurological:     Mental Status: He is alert.  Psychiatric:        Mood and Affect: Mood normal.      UC Treatments / Results  Labs (all labs ordered are listed, but only abnormal results are displayed) Labs Reviewed - No data to display  EKG   Radiology No results found.  Procedures Procedures (including critical care time)  Medications Ordered in UC Medications - No data to display  Initial Impression / Assessment and Plan / UC Course  I have reviewed the triage vital signs and the nursing notes.  Pertinent labs & imaging results that were available during my care of the patient were reviewed by me and considered in my medical decision making (see chart for details).     Herpes zoster  Treated with Valtrex 3 times a day for 7 days. Ibuprofen and Tylenol every 8 hours Hydrocodone given for severe pain at night Recommended to not exceed greater than 3000 mg of Tylenol every 24 hours Patient understanding and agreed to plan. Follow up as needed for continued or worsening symptoms  Final Clinical Impressions(s) / UC Diagnoses   Final diagnoses:  Herpes zoster without complication     Discharge Instructions     This is shingles  Take the valtrex daily for 7 days.  800 ibuprofen and 1000 tylenol every 8 hours.  Hydrocodone at night as needed for worse pain.     ED Prescriptions    Medication Sig Dispense Auth. Provider   valACYclovir (VALTREX) 1000 MG tablet Take 1 tablet (1,000 mg total) by mouth 3 (three) times daily. 21 tablet Shandon Matson A, NP   ibuprofen (ADVIL) 600 MG tablet Take 1 tablet (600 mg total) by mouth every 8 (eight) hours as needed for moderate pain. 30 tablet Klaus Casteneda A, NP   acetaminophen (TYLENOL) 500 MG tablet Take 1 tablet (500 mg total) by mouth every 6 (six) hours as needed. 30 tablet Keeanna Villafranca A, NP   HYDROcodone-acetaminophen (NORCO/VICODIN) 5-325 MG tablet Take 1-2 tablets by mouth every 6 (six) hours as needed. 10  tablet Cintya Daughety A, NP     I have reviewed the PDMP during this encounter.   , NP  10/03/20 1016  

## 2020-10-03 NOTE — Discharge Instructions (Addendum)
This is shingles  Take the valtrex daily for 7 days.  800 ibuprofen and 1000 tylenol every 8 hours.  Hydrocodone at night as needed for worse pain.

## 2020-10-03 NOTE — ED Triage Notes (Signed)
Pt reports upper Lt back pain started Sunday. Pt went to his PCP Monday and received an injection to site and muscle relaxer . Pt reports this has not helped. Pt pain score is 6/10

## 2021-03-05 ENCOUNTER — Other Ambulatory Visit (HOSPITAL_COMMUNITY): Payer: Self-pay | Admitting: Family Medicine

## 2021-03-14 ENCOUNTER — Other Ambulatory Visit (HOSPITAL_COMMUNITY): Payer: Self-pay

## 2021-03-14 MED FILL — IBUPROFEN 800 MG TABS: 800 | 10 days supply | Qty: 30 | Fill #0

## 2021-09-18 ENCOUNTER — Other Ambulatory Visit (HOSPITAL_COMMUNITY): Payer: Self-pay

## 2021-09-18 MED ORDER — AZITHROMYCIN 250 MG PO TABS
ORAL_TABLET | ORAL | 0 refills | Status: DC
  Filled 2021-09-18: qty 6, 5d supply, fill #0

## 2021-10-23 ENCOUNTER — Other Ambulatory Visit (HOSPITAL_COMMUNITY): Payer: Self-pay

## 2021-10-23 MED ORDER — OMEPRAZOLE 40 MG PO CPDR
40.0000 mg | DELAYED_RELEASE_CAPSULE | Freq: Every day | ORAL | 2 refills | Status: DC
  Filled 2021-10-23: qty 90, 90d supply, fill #0

## 2021-10-23 MED ORDER — OMEPRAZOLE 40 MG PO CPDR
40.0000 mg | DELAYED_RELEASE_CAPSULE | Freq: Every day | ORAL | 2 refills | Status: DC
  Filled 2021-10-23 – 2021-11-03 (×2): qty 90, 90d supply, fill #0
  Filled 2022-04-06: qty 90, 90d supply, fill #1

## 2021-10-31 ENCOUNTER — Other Ambulatory Visit (HOSPITAL_COMMUNITY): Payer: Self-pay

## 2021-11-03 ENCOUNTER — Other Ambulatory Visit (HOSPITAL_COMMUNITY): Payer: Self-pay

## 2022-02-09 ENCOUNTER — Other Ambulatory Visit (HOSPITAL_COMMUNITY): Payer: Self-pay

## 2022-02-09 MED ORDER — MONTELUKAST SODIUM 10 MG PO TABS
10.0000 mg | ORAL_TABLET | Freq: Every day | ORAL | 3 refills | Status: DC
  Filled 2022-02-09: qty 90, 90d supply, fill #0
  Filled 2022-05-19: qty 90, 90d supply, fill #1

## 2022-02-09 MED ORDER — LEVOCETIRIZINE DIHYDROCHLORIDE 5 MG PO TABS
5.0000 mg | ORAL_TABLET | Freq: Every evening | ORAL | 3 refills | Status: DC
  Filled 2022-02-09: qty 90, 90d supply, fill #0
  Filled 2022-05-19: qty 90, 90d supply, fill #1

## 2022-03-20 ENCOUNTER — Ambulatory Visit: Payer: Self-pay | Attending: Internal Medicine

## 2022-03-20 DIAGNOSIS — Z23 Encounter for immunization: Secondary | ICD-10-CM

## 2022-03-23 ENCOUNTER — Other Ambulatory Visit (HOSPITAL_BASED_OUTPATIENT_CLINIC_OR_DEPARTMENT_OTHER): Payer: Self-pay

## 2022-03-23 MED ORDER — PFIZER COVID-19 VAC BIVALENT 30 MCG/0.3ML IM SUSP
INTRAMUSCULAR | 0 refills | Status: DC
  Filled 2022-03-23: qty 0.3, 1d supply, fill #0

## 2022-03-23 NOTE — Progress Notes (Signed)
? ?  Covid-19 Vaccination Clinic ? ?Name:  George Nguyen    ?MRN: [id number] ?DOB: 07-22-1986 ? ?03/23/2022 ? ?[name] was observed post Covid-19 immunization for 15 minutes without incident. He was provided with Vaccine Information Sheet and instruction to access the V-Safe system.  ? ?Mr. Pickup was instructed to call 911 with any severe reactions post vaccine: ?Difficulty breathing  ?Swelling of face and throat  ?A fast heartbeat  ?A bad rash all over body  ?Dizziness and weakness  ? ?Immunizations Administered   ? ? Name Date Dose VIS Date Route  ? Art gallery manager Booster 03/20/2022 12:00 PM 0.3 mL 08/20/2021 Intramuscular  ? Manufacturer: [employer]  ? Lot: [id number]  ? NDC: [phone]  ? ?  ? ? ?

## 2022-04-06 ENCOUNTER — Other Ambulatory Visit (HOSPITAL_COMMUNITY): Payer: Self-pay

## 2022-04-30 ENCOUNTER — Other Ambulatory Visit (HOSPITAL_COMMUNITY): Payer: Self-pay

## 2022-04-30 MED ORDER — CYCLOBENZAPRINE HCL 10 MG PO TABS
10.0000 mg | ORAL_TABLET | Freq: Three times a day (TID) | ORAL | 0 refills | Status: DC | PRN
  Filled 2022-04-30: qty 30, 10d supply, fill #0

## 2022-05-19 ENCOUNTER — Other Ambulatory Visit (HOSPITAL_COMMUNITY): Payer: Self-pay

## 2022-06-17 ENCOUNTER — Other Ambulatory Visit (HOSPITAL_COMMUNITY): Payer: Self-pay

## 2022-06-17 MED ORDER — KETOCONAZOLE 2 % EX SHAM
MEDICATED_SHAMPOO | Freq: Every day | CUTANEOUS | 0 refills | Status: DC
  Filled 2022-06-17: qty 120, 30d supply, fill #0

## 2022-06-17 MED ORDER — FLUCONAZOLE 200 MG PO TABS
200.0000 mg | ORAL_TABLET | ORAL | 0 refills | Status: DC
  Filled 2022-06-17: qty 4, 28d supply, fill #0

## 2022-06-27 ENCOUNTER — Ambulatory Visit (INDEPENDENT_AMBULATORY_CARE_PROVIDER_SITE_OTHER): Payer: No Typology Code available for payment source

## 2022-06-27 ENCOUNTER — Ambulatory Visit: Payer: No Typology Code available for payment source | Admitting: Podiatry

## 2022-06-27 DIAGNOSIS — M779 Enthesopathy, unspecified: Secondary | ICD-10-CM | POA: Diagnosis not present

## 2022-06-27 DIAGNOSIS — G5791 Unspecified mononeuropathy of right lower limb: Secondary | ICD-10-CM

## 2022-06-27 MED ORDER — MELOXICAM 15 MG PO TABS
15.0000 mg | ORAL_TABLET | Freq: Every day | ORAL | 1 refills | Status: DC
  Filled 2022-06-27: qty 30, 30d supply, fill #0

## 2022-06-27 NOTE — Progress Notes (Signed)
   HPI: 36 y.o. male presenting today as a new patient for evaluation of intermittent right foot and ankle pain that extends up into his leg.  This has been intermittent pain for the past year off-and-on.  He has not experienced an episode for the past 3 weeks.  Patient states that immediately he will get a sharp shooting sensation that extends all the way up into his leg.  He feels as if he strains or irritates the nerve.  He says that the foot will swell slightly for a few days and the pain eventually resolves after a few days.  Denies a history of injury.  He has tried OTC Dr. Margart Sickles arch supports with minimal relief.  Past Medical History:  Diagnosis Date   GERD (gastroesophageal reflux disease)    OTC meds   Tuberculosis 2011   completed INH    Past Surgical History:  Procedure Laterality Date   APPENDECTOMY  2009   TONSILLECTOMY Bilateral 07/06/2014   Procedure: TONSILLECTOMY;  Surgeon: Serena Colonel, MD;  Location: [facility] OR;  Service: ENT;  Laterality: Bilateral;    No Known Allergies   Physical Exam: General: The patient is alert and oriented x3 in no acute distress.  Dermatology: Skin is warm, dry and supple bilateral lower extremities. Negative for open lesions or macerations.  Vascular: Palpable pedal pulses bilaterally. Capillary refill within normal limits.  Negative for any significant edema or erythema  Neurological: Light touch and protective threshold grossly intact  Musculoskeletal Exam: No pedal deformities noted.  Currently the patient is asymptomatic  Radiographic Exam:  Normal osseous mineralization. Joint spaces preserved. No fracture/dislocation/boney destruction.  No acute osseous abnormality  Assessment: 1.  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 Patient evaluated. X-Rays reviewed.  2.  Based on our discussion today I do believe the patient is suffering from intermittent inflammation of the tibial nerve.   Recommend good supportive arch supports to support the medial longitudinal arch of the foot. 3.  OTC power step insoles dispensed at checkout 4.  Prescription for meloxicam 15 mg daily as needed 5.  Return to clinic as needed  *Respiratory therapist for [facility]      Felecia Shelling, DPM [facility]  Dr. Felecia Shelling, DPM    [address] [place], [place] [address]                Office [phone]  Fax [phone]

## 2022-06-29 ENCOUNTER — Other Ambulatory Visit (HOSPITAL_COMMUNITY): Payer: Self-pay

## 2022-06-29 MED ORDER — SCOPOLAMINE 1 MG/3DAYS TD PT72
1.0000 | MEDICATED_PATCH | TRANSDERMAL | 0 refills | Status: DC
  Filled 2022-06-29: qty 5, 15d supply, fill #0

## 2022-09-24 ENCOUNTER — Other Ambulatory Visit (HOSPITAL_COMMUNITY): Payer: Self-pay

## 2022-09-25 ENCOUNTER — Other Ambulatory Visit (HOSPITAL_COMMUNITY): Payer: Self-pay

## 2022-09-25 MED ORDER — OMEPRAZOLE 40 MG PO CPDR
40.0000 mg | DELAYED_RELEASE_CAPSULE | Freq: Every day | ORAL | 2 refills | Status: DC
  Filled 2022-09-25: qty 90, 90d supply, fill #0
  Filled 2023-04-05: qty 90, 90d supply, fill #1

## 2022-11-20 ENCOUNTER — Other Ambulatory Visit (HOSPITAL_COMMUNITY): Payer: Self-pay

## 2022-11-20 MED ORDER — AZELASTINE-FLUTICASONE 137-50 MCG/ACT NA SUSP
1.0000 | Freq: Two times a day (BID) | NASAL | 0 refills | Status: AC
  Filled 2022-11-20: qty 23, 30d supply, fill #0

## 2022-11-20 MED ORDER — BENZONATATE 200 MG PO CAPS
200.0000 mg | ORAL_CAPSULE | Freq: Three times a day (TID) | ORAL | 0 refills | Status: DC
  Filled 2022-11-20: qty 42, 14d supply, fill #0

## 2022-11-20 MED ORDER — PREDNISONE 20 MG PO TABS
40.0000 mg | ORAL_TABLET | Freq: Every day | ORAL | 0 refills | Status: DC
  Filled 2022-11-20: qty 8, 4d supply, fill #0

## 2022-11-21 ENCOUNTER — Other Ambulatory Visit (HOSPITAL_COMMUNITY): Payer: Self-pay

## 2022-12-09 ENCOUNTER — Other Ambulatory Visit (HOSPITAL_COMMUNITY): Payer: Self-pay

## 2022-12-09 MED ORDER — MELOXICAM 15 MG PO TABS
15.0000 mg | ORAL_TABLET | Freq: Every day | ORAL | 0 refills | Status: DC
  Filled 2022-12-09: qty 30, 30d supply, fill #0

## 2023-03-24 ENCOUNTER — Other Ambulatory Visit (HOSPITAL_COMMUNITY): Payer: Self-pay

## 2023-03-24 ENCOUNTER — Other Ambulatory Visit: Payer: Self-pay

## 2023-03-24 DIAGNOSIS — J301 Allergic rhinitis due to pollen: Secondary | ICD-10-CM | POA: Diagnosis not present

## 2023-03-24 DIAGNOSIS — H1045 Other chronic allergic conjunctivitis: Secondary | ICD-10-CM | POA: Diagnosis not present

## 2023-03-24 DIAGNOSIS — J3089 Other allergic rhinitis: Secondary | ICD-10-CM | POA: Diagnosis not present

## 2023-03-24 MED ORDER — EPINEPHRINE 0.3 MG/0.3ML IJ SOAJ
INTRAMUSCULAR | 1 refills | Status: AC
  Filled 2023-03-24: qty 2, 2d supply, fill #0

## 2023-03-24 MED ORDER — AZELASTINE HCL 0.05 % OP SOLN
1.0000 [drp] | Freq: Two times a day (BID) | OPHTHALMIC | 3 refills | Status: AC
  Filled 2023-03-24: qty 6, 25d supply, fill #0
  Filled 2023-04-09 (×2): qty 6, 30d supply, fill #0

## 2023-03-24 MED ORDER — FLUTICASONE PROPIONATE 50 MCG/ACT NA SUSP
1.0000 | Freq: Every day | NASAL | 3 refills | Status: AC
  Filled 2023-03-24: qty 16, 30d supply, fill #0

## 2023-04-01 DIAGNOSIS — J301 Allergic rhinitis due to pollen: Secondary | ICD-10-CM | POA: Diagnosis not present

## 2023-04-05 ENCOUNTER — Other Ambulatory Visit (HOSPITAL_COMMUNITY): Payer: Self-pay

## 2023-04-06 ENCOUNTER — Other Ambulatory Visit: Payer: Self-pay

## 2023-04-06 ENCOUNTER — Other Ambulatory Visit (HOSPITAL_COMMUNITY): Payer: Self-pay

## 2023-04-07 ENCOUNTER — Other Ambulatory Visit (HOSPITAL_COMMUNITY): Payer: Self-pay

## 2023-04-07 DIAGNOSIS — J301 Allergic rhinitis due to pollen: Secondary | ICD-10-CM | POA: Diagnosis not present

## 2023-04-07 DIAGNOSIS — J3081 Allergic rhinitis due to animal (cat) (dog) hair and dander: Secondary | ICD-10-CM | POA: Diagnosis not present

## 2023-04-07 DIAGNOSIS — J3089 Other allergic rhinitis: Secondary | ICD-10-CM | POA: Diagnosis not present

## 2023-04-08 ENCOUNTER — Other Ambulatory Visit (HOSPITAL_COMMUNITY): Payer: Self-pay

## 2023-04-08 MED ORDER — MONTELUKAST SODIUM 10 MG PO TABS
10.0000 mg | ORAL_TABLET | Freq: Every day | ORAL | 0 refills | Status: DC
  Filled 2023-04-08: qty 90, 90d supply, fill #0

## 2023-04-09 ENCOUNTER — Other Ambulatory Visit (HOSPITAL_COMMUNITY): Payer: Self-pay

## 2023-04-09 DIAGNOSIS — J3081 Allergic rhinitis due to animal (cat) (dog) hair and dander: Secondary | ICD-10-CM | POA: Diagnosis not present

## 2023-04-09 DIAGNOSIS — J301 Allergic rhinitis due to pollen: Secondary | ICD-10-CM | POA: Diagnosis not present

## 2023-04-09 DIAGNOSIS — J3089 Other allergic rhinitis: Secondary | ICD-10-CM | POA: Diagnosis not present

## 2023-04-12 DIAGNOSIS — J301 Allergic rhinitis due to pollen: Secondary | ICD-10-CM | POA: Diagnosis not present

## 2023-04-12 DIAGNOSIS — J3081 Allergic rhinitis due to animal (cat) (dog) hair and dander: Secondary | ICD-10-CM | POA: Diagnosis not present

## 2023-04-12 DIAGNOSIS — J3089 Other allergic rhinitis: Secondary | ICD-10-CM | POA: Diagnosis not present

## 2023-04-14 DIAGNOSIS — J3081 Allergic rhinitis due to animal (cat) (dog) hair and dander: Secondary | ICD-10-CM | POA: Diagnosis not present

## 2023-04-14 DIAGNOSIS — J301 Allergic rhinitis due to pollen: Secondary | ICD-10-CM | POA: Diagnosis not present

## 2023-04-14 DIAGNOSIS — J3089 Other allergic rhinitis: Secondary | ICD-10-CM | POA: Diagnosis not present

## 2023-04-15 ENCOUNTER — Other Ambulatory Visit (HOSPITAL_COMMUNITY): Payer: Self-pay

## 2023-04-16 DIAGNOSIS — J301 Allergic rhinitis due to pollen: Secondary | ICD-10-CM | POA: Diagnosis not present

## 2023-04-19 DIAGNOSIS — J301 Allergic rhinitis due to pollen: Secondary | ICD-10-CM | POA: Diagnosis not present

## 2023-04-19 DIAGNOSIS — J3089 Other allergic rhinitis: Secondary | ICD-10-CM | POA: Diagnosis not present

## 2023-04-19 DIAGNOSIS — J3081 Allergic rhinitis due to animal (cat) (dog) hair and dander: Secondary | ICD-10-CM | POA: Diagnosis not present

## 2023-04-21 DIAGNOSIS — J301 Allergic rhinitis due to pollen: Secondary | ICD-10-CM | POA: Diagnosis not present

## 2023-04-21 DIAGNOSIS — J3089 Other allergic rhinitis: Secondary | ICD-10-CM | POA: Diagnosis not present

## 2023-04-21 DIAGNOSIS — J3081 Allergic rhinitis due to animal (cat) (dog) hair and dander: Secondary | ICD-10-CM | POA: Diagnosis not present

## 2023-04-26 DIAGNOSIS — J3089 Other allergic rhinitis: Secondary | ICD-10-CM | POA: Diagnosis not present

## 2023-04-26 DIAGNOSIS — J3081 Allergic rhinitis due to animal (cat) (dog) hair and dander: Secondary | ICD-10-CM | POA: Diagnosis not present

## 2023-04-26 DIAGNOSIS — J301 Allergic rhinitis due to pollen: Secondary | ICD-10-CM | POA: Diagnosis not present

## 2023-04-28 DIAGNOSIS — J3081 Allergic rhinitis due to animal (cat) (dog) hair and dander: Secondary | ICD-10-CM | POA: Diagnosis not present

## 2023-04-28 DIAGNOSIS — J301 Allergic rhinitis due to pollen: Secondary | ICD-10-CM | POA: Diagnosis not present

## 2023-04-28 DIAGNOSIS — J3089 Other allergic rhinitis: Secondary | ICD-10-CM | POA: Diagnosis not present

## 2023-05-03 DIAGNOSIS — K219 Gastro-esophageal reflux disease without esophagitis: Secondary | ICD-10-CM | POA: Diagnosis not present

## 2023-05-03 DIAGNOSIS — J3081 Allergic rhinitis due to animal (cat) (dog) hair and dander: Secondary | ICD-10-CM | POA: Diagnosis not present

## 2023-05-03 DIAGNOSIS — E782 Mixed hyperlipidemia: Secondary | ICD-10-CM | POA: Diagnosis not present

## 2023-05-03 DIAGNOSIS — J301 Allergic rhinitis due to pollen: Secondary | ICD-10-CM | POA: Diagnosis not present

## 2023-05-03 DIAGNOSIS — Z0000 Encounter for general adult medical examination without abnormal findings: Secondary | ICD-10-CM | POA: Diagnosis not present

## 2023-05-03 DIAGNOSIS — E559 Vitamin D deficiency, unspecified: Secondary | ICD-10-CM | POA: Diagnosis not present

## 2023-05-03 DIAGNOSIS — J3089 Other allergic rhinitis: Secondary | ICD-10-CM | POA: Diagnosis not present

## 2023-05-03 DIAGNOSIS — J302 Other seasonal allergic rhinitis: Secondary | ICD-10-CM | POA: Diagnosis not present

## 2023-05-05 DIAGNOSIS — J3081 Allergic rhinitis due to animal (cat) (dog) hair and dander: Secondary | ICD-10-CM | POA: Diagnosis not present

## 2023-05-05 DIAGNOSIS — J301 Allergic rhinitis due to pollen: Secondary | ICD-10-CM | POA: Diagnosis not present

## 2023-05-05 DIAGNOSIS — J3089 Other allergic rhinitis: Secondary | ICD-10-CM | POA: Diagnosis not present

## 2023-05-10 DIAGNOSIS — J301 Allergic rhinitis due to pollen: Secondary | ICD-10-CM | POA: Diagnosis not present

## 2023-05-10 DIAGNOSIS — J3081 Allergic rhinitis due to animal (cat) (dog) hair and dander: Secondary | ICD-10-CM | POA: Diagnosis not present

## 2023-05-10 DIAGNOSIS — J3089 Other allergic rhinitis: Secondary | ICD-10-CM | POA: Diagnosis not present

## 2023-05-18 DIAGNOSIS — J3081 Allergic rhinitis due to animal (cat) (dog) hair and dander: Secondary | ICD-10-CM | POA: Diagnosis not present

## 2023-05-18 DIAGNOSIS — J301 Allergic rhinitis due to pollen: Secondary | ICD-10-CM | POA: Diagnosis not present

## 2023-05-18 DIAGNOSIS — J3089 Other allergic rhinitis: Secondary | ICD-10-CM | POA: Diagnosis not present

## 2023-05-21 DIAGNOSIS — J301 Allergic rhinitis due to pollen: Secondary | ICD-10-CM | POA: Diagnosis not present

## 2023-05-24 DIAGNOSIS — J301 Allergic rhinitis due to pollen: Secondary | ICD-10-CM | POA: Diagnosis not present

## 2023-05-24 DIAGNOSIS — J3089 Other allergic rhinitis: Secondary | ICD-10-CM | POA: Diagnosis not present

## 2023-05-24 DIAGNOSIS — J3081 Allergic rhinitis due to animal (cat) (dog) hair and dander: Secondary | ICD-10-CM | POA: Diagnosis not present

## 2023-05-31 DIAGNOSIS — J301 Allergic rhinitis due to pollen: Secondary | ICD-10-CM | POA: Diagnosis not present

## 2023-05-31 DIAGNOSIS — J3089 Other allergic rhinitis: Secondary | ICD-10-CM | POA: Diagnosis not present

## 2023-05-31 DIAGNOSIS — J3081 Allergic rhinitis due to animal (cat) (dog) hair and dander: Secondary | ICD-10-CM | POA: Diagnosis not present

## 2023-06-03 DIAGNOSIS — J3089 Other allergic rhinitis: Secondary | ICD-10-CM | POA: Diagnosis not present

## 2023-06-03 DIAGNOSIS — J301 Allergic rhinitis due to pollen: Secondary | ICD-10-CM | POA: Diagnosis not present

## 2023-06-03 DIAGNOSIS — J3081 Allergic rhinitis due to animal (cat) (dog) hair and dander: Secondary | ICD-10-CM | POA: Diagnosis not present

## 2023-06-09 DIAGNOSIS — J301 Allergic rhinitis due to pollen: Secondary | ICD-10-CM | POA: Diagnosis not present

## 2023-06-16 DIAGNOSIS — J3089 Other allergic rhinitis: Secondary | ICD-10-CM | POA: Diagnosis not present

## 2023-06-16 DIAGNOSIS — J3081 Allergic rhinitis due to animal (cat) (dog) hair and dander: Secondary | ICD-10-CM | POA: Diagnosis not present

## 2023-06-16 DIAGNOSIS — J301 Allergic rhinitis due to pollen: Secondary | ICD-10-CM | POA: Diagnosis not present

## 2023-06-21 DIAGNOSIS — J3081 Allergic rhinitis due to animal (cat) (dog) hair and dander: Secondary | ICD-10-CM | POA: Diagnosis not present

## 2023-06-21 DIAGNOSIS — J3089 Other allergic rhinitis: Secondary | ICD-10-CM | POA: Diagnosis not present

## 2023-06-21 DIAGNOSIS — J301 Allergic rhinitis due to pollen: Secondary | ICD-10-CM | POA: Diagnosis not present

## 2023-07-01 DIAGNOSIS — J301 Allergic rhinitis due to pollen: Secondary | ICD-10-CM | POA: Diagnosis not present

## 2023-07-01 DIAGNOSIS — J3089 Other allergic rhinitis: Secondary | ICD-10-CM | POA: Diagnosis not present

## 2023-07-07 DIAGNOSIS — J301 Allergic rhinitis due to pollen: Secondary | ICD-10-CM | POA: Diagnosis not present

## 2023-07-14 ENCOUNTER — Other Ambulatory Visit (HOSPITAL_COMMUNITY): Payer: Self-pay

## 2023-07-14 DIAGNOSIS — J301 Allergic rhinitis due to pollen: Secondary | ICD-10-CM | POA: Diagnosis not present

## 2023-07-21 DIAGNOSIS — J301 Allergic rhinitis due to pollen: Secondary | ICD-10-CM | POA: Diagnosis not present

## 2023-07-21 DIAGNOSIS — J3089 Other allergic rhinitis: Secondary | ICD-10-CM | POA: Diagnosis not present

## 2023-07-21 DIAGNOSIS — J3081 Allergic rhinitis due to animal (cat) (dog) hair and dander: Secondary | ICD-10-CM | POA: Diagnosis not present

## 2023-07-28 DIAGNOSIS — J301 Allergic rhinitis due to pollen: Secondary | ICD-10-CM | POA: Diagnosis not present

## 2023-07-28 DIAGNOSIS — J3089 Other allergic rhinitis: Secondary | ICD-10-CM | POA: Diagnosis not present

## 2023-07-28 DIAGNOSIS — J3081 Allergic rhinitis due to animal (cat) (dog) hair and dander: Secondary | ICD-10-CM | POA: Diagnosis not present

## 2023-08-05 ENCOUNTER — Other Ambulatory Visit (HOSPITAL_COMMUNITY): Payer: Self-pay

## 2023-08-05 DIAGNOSIS — J3081 Allergic rhinitis due to animal (cat) (dog) hair and dander: Secondary | ICD-10-CM | POA: Diagnosis not present

## 2023-08-05 DIAGNOSIS — J3089 Other allergic rhinitis: Secondary | ICD-10-CM | POA: Diagnosis not present

## 2023-08-05 DIAGNOSIS — J301 Allergic rhinitis due to pollen: Secondary | ICD-10-CM | POA: Diagnosis not present

## 2023-08-09 ENCOUNTER — Other Ambulatory Visit (HOSPITAL_COMMUNITY): Payer: Self-pay

## 2023-08-10 ENCOUNTER — Other Ambulatory Visit (HOSPITAL_COMMUNITY): Payer: Self-pay

## 2023-08-11 ENCOUNTER — Other Ambulatory Visit (HOSPITAL_COMMUNITY): Payer: Self-pay

## 2023-08-11 DIAGNOSIS — J301 Allergic rhinitis due to pollen: Secondary | ICD-10-CM | POA: Diagnosis not present

## 2023-08-11 DIAGNOSIS — J3089 Other allergic rhinitis: Secondary | ICD-10-CM | POA: Diagnosis not present

## 2023-08-11 DIAGNOSIS — J3081 Allergic rhinitis due to animal (cat) (dog) hair and dander: Secondary | ICD-10-CM | POA: Diagnosis not present

## 2023-08-11 MED ORDER — MONTELUKAST SODIUM 10 MG PO TABS
10.0000 mg | ORAL_TABLET | Freq: Every day | ORAL | 1 refills | Status: DC
  Filled 2023-08-11: qty 90, 90d supply, fill #0
  Filled 2023-11-23: qty 90, 90d supply, fill #1

## 2023-08-13 ENCOUNTER — Other Ambulatory Visit (HOSPITAL_COMMUNITY): Payer: Self-pay

## 2023-08-18 DIAGNOSIS — J301 Allergic rhinitis due to pollen: Secondary | ICD-10-CM | POA: Diagnosis not present

## 2023-08-20 ENCOUNTER — Other Ambulatory Visit (HOSPITAL_COMMUNITY): Payer: Self-pay

## 2023-08-20 MED ORDER — LEVOCETIRIZINE DIHYDROCHLORIDE 5 MG PO TABS
5.0000 mg | ORAL_TABLET | Freq: Every evening | ORAL | 3 refills | Status: DC
Start: 2023-08-20 — End: 2024-05-02
  Filled 2023-08-20: qty 30, 30d supply, fill #0
  Filled 2024-01-17: qty 30, 30d supply, fill #1
  Filled 2024-02-25: qty 60, 60d supply, fill #2

## 2023-08-25 DIAGNOSIS — J3081 Allergic rhinitis due to animal (cat) (dog) hair and dander: Secondary | ICD-10-CM | POA: Diagnosis not present

## 2023-08-25 DIAGNOSIS — J3089 Other allergic rhinitis: Secondary | ICD-10-CM | POA: Diagnosis not present

## 2023-08-25 DIAGNOSIS — J301 Allergic rhinitis due to pollen: Secondary | ICD-10-CM | POA: Diagnosis not present

## 2023-09-01 DIAGNOSIS — J3081 Allergic rhinitis due to animal (cat) (dog) hair and dander: Secondary | ICD-10-CM | POA: Diagnosis not present

## 2023-09-01 DIAGNOSIS — J301 Allergic rhinitis due to pollen: Secondary | ICD-10-CM | POA: Diagnosis not present

## 2023-09-01 DIAGNOSIS — J3089 Other allergic rhinitis: Secondary | ICD-10-CM | POA: Diagnosis not present

## 2023-09-02 DIAGNOSIS — J301 Allergic rhinitis due to pollen: Secondary | ICD-10-CM | POA: Diagnosis not present

## 2023-09-13 DIAGNOSIS — J3081 Allergic rhinitis due to animal (cat) (dog) hair and dander: Secondary | ICD-10-CM | POA: Diagnosis not present

## 2023-09-13 DIAGNOSIS — J3089 Other allergic rhinitis: Secondary | ICD-10-CM | POA: Diagnosis not present

## 2023-09-13 DIAGNOSIS — J301 Allergic rhinitis due to pollen: Secondary | ICD-10-CM | POA: Diagnosis not present

## 2023-09-23 DIAGNOSIS — J3081 Allergic rhinitis due to animal (cat) (dog) hair and dander: Secondary | ICD-10-CM | POA: Diagnosis not present

## 2023-09-23 DIAGNOSIS — J3089 Other allergic rhinitis: Secondary | ICD-10-CM | POA: Diagnosis not present

## 2023-09-23 DIAGNOSIS — J301 Allergic rhinitis due to pollen: Secondary | ICD-10-CM | POA: Diagnosis not present

## 2023-09-27 ENCOUNTER — Other Ambulatory Visit (HOSPITAL_COMMUNITY): Payer: Self-pay

## 2023-09-27 DIAGNOSIS — J3081 Allergic rhinitis due to animal (cat) (dog) hair and dander: Secondary | ICD-10-CM | POA: Diagnosis not present

## 2023-09-27 DIAGNOSIS — J301 Allergic rhinitis due to pollen: Secondary | ICD-10-CM | POA: Diagnosis not present

## 2023-09-27 DIAGNOSIS — J3089 Other allergic rhinitis: Secondary | ICD-10-CM | POA: Diagnosis not present

## 2023-09-27 DIAGNOSIS — H1045 Other chronic allergic conjunctivitis: Secondary | ICD-10-CM | POA: Diagnosis not present

## 2023-09-27 MED ORDER — LEVOCETIRIZINE DIHYDROCHLORIDE 5 MG PO TABS
5.0000 mg | ORAL_TABLET | Freq: Every evening | ORAL | 3 refills | Status: AC
  Filled 2023-09-27: qty 90, 90d supply, fill #0
  Filled 2024-05-02: qty 90, 90d supply, fill #1
  Filled 2024-08-29: qty 90, 90d supply, fill #2

## 2023-10-06 DIAGNOSIS — J301 Allergic rhinitis due to pollen: Secondary | ICD-10-CM | POA: Diagnosis not present

## 2023-10-06 DIAGNOSIS — J3081 Allergic rhinitis due to animal (cat) (dog) hair and dander: Secondary | ICD-10-CM | POA: Diagnosis not present

## 2023-10-06 DIAGNOSIS — J3089 Other allergic rhinitis: Secondary | ICD-10-CM | POA: Diagnosis not present

## 2023-10-13 DIAGNOSIS — J301 Allergic rhinitis due to pollen: Secondary | ICD-10-CM | POA: Diagnosis not present

## 2023-10-14 DIAGNOSIS — Z0100 Encounter for examination of eyes and vision without abnormal findings: Secondary | ICD-10-CM | POA: Diagnosis not present

## 2023-10-20 DIAGNOSIS — J3089 Other allergic rhinitis: Secondary | ICD-10-CM | POA: Diagnosis not present

## 2023-10-20 DIAGNOSIS — J3081 Allergic rhinitis due to animal (cat) (dog) hair and dander: Secondary | ICD-10-CM | POA: Diagnosis not present

## 2023-10-20 DIAGNOSIS — J301 Allergic rhinitis due to pollen: Secondary | ICD-10-CM | POA: Diagnosis not present

## 2023-10-27 DIAGNOSIS — J3089 Other allergic rhinitis: Secondary | ICD-10-CM | POA: Diagnosis not present

## 2023-10-27 DIAGNOSIS — J3081 Allergic rhinitis due to animal (cat) (dog) hair and dander: Secondary | ICD-10-CM | POA: Diagnosis not present

## 2023-10-27 DIAGNOSIS — J301 Allergic rhinitis due to pollen: Secondary | ICD-10-CM | POA: Diagnosis not present

## 2023-11-03 DIAGNOSIS — J301 Allergic rhinitis due to pollen: Secondary | ICD-10-CM | POA: Diagnosis not present

## 2023-11-10 DIAGNOSIS — J301 Allergic rhinitis due to pollen: Secondary | ICD-10-CM | POA: Diagnosis not present

## 2023-11-17 DIAGNOSIS — J3081 Allergic rhinitis due to animal (cat) (dog) hair and dander: Secondary | ICD-10-CM | POA: Diagnosis not present

## 2023-11-17 DIAGNOSIS — J301 Allergic rhinitis due to pollen: Secondary | ICD-10-CM | POA: Diagnosis not present

## 2023-11-17 DIAGNOSIS — J3089 Other allergic rhinitis: Secondary | ICD-10-CM | POA: Diagnosis not present

## 2023-11-24 DIAGNOSIS — J301 Allergic rhinitis due to pollen: Secondary | ICD-10-CM | POA: Diagnosis not present

## 2023-11-30 DIAGNOSIS — J301 Allergic rhinitis due to pollen: Secondary | ICD-10-CM | POA: Diagnosis not present

## 2023-11-30 DIAGNOSIS — J3081 Allergic rhinitis due to animal (cat) (dog) hair and dander: Secondary | ICD-10-CM | POA: Diagnosis not present

## 2023-11-30 DIAGNOSIS — J3089 Other allergic rhinitis: Secondary | ICD-10-CM | POA: Diagnosis not present

## 2023-12-10 DIAGNOSIS — J3089 Other allergic rhinitis: Secondary | ICD-10-CM | POA: Diagnosis not present

## 2023-12-10 DIAGNOSIS — J301 Allergic rhinitis due to pollen: Secondary | ICD-10-CM | POA: Diagnosis not present

## 2023-12-16 DIAGNOSIS — J3089 Other allergic rhinitis: Secondary | ICD-10-CM | POA: Diagnosis not present

## 2023-12-16 DIAGNOSIS — J301 Allergic rhinitis due to pollen: Secondary | ICD-10-CM | POA: Diagnosis not present

## 2023-12-16 DIAGNOSIS — J3081 Allergic rhinitis due to animal (cat) (dog) hair and dander: Secondary | ICD-10-CM | POA: Diagnosis not present

## 2023-12-27 DIAGNOSIS — J3081 Allergic rhinitis due to animal (cat) (dog) hair and dander: Secondary | ICD-10-CM | POA: Diagnosis not present

## 2023-12-27 DIAGNOSIS — J301 Allergic rhinitis due to pollen: Secondary | ICD-10-CM | POA: Diagnosis not present

## 2023-12-27 DIAGNOSIS — J3089 Other allergic rhinitis: Secondary | ICD-10-CM | POA: Diagnosis not present

## 2024-01-06 DIAGNOSIS — J301 Allergic rhinitis due to pollen: Secondary | ICD-10-CM | POA: Diagnosis not present

## 2024-01-12 DIAGNOSIS — J3089 Other allergic rhinitis: Secondary | ICD-10-CM | POA: Diagnosis not present

## 2024-01-12 DIAGNOSIS — J3081 Allergic rhinitis due to animal (cat) (dog) hair and dander: Secondary | ICD-10-CM | POA: Diagnosis not present

## 2024-01-12 DIAGNOSIS — J301 Allergic rhinitis due to pollen: Secondary | ICD-10-CM | POA: Diagnosis not present

## 2024-01-18 ENCOUNTER — Other Ambulatory Visit (HOSPITAL_COMMUNITY): Payer: Self-pay

## 2024-01-19 DIAGNOSIS — J301 Allergic rhinitis due to pollen: Secondary | ICD-10-CM | POA: Diagnosis not present

## 2024-01-19 DIAGNOSIS — J3089 Other allergic rhinitis: Secondary | ICD-10-CM | POA: Diagnosis not present

## 2024-01-27 DIAGNOSIS — J3081 Allergic rhinitis due to animal (cat) (dog) hair and dander: Secondary | ICD-10-CM | POA: Diagnosis not present

## 2024-01-27 DIAGNOSIS — J3089 Other allergic rhinitis: Secondary | ICD-10-CM | POA: Diagnosis not present

## 2024-01-27 DIAGNOSIS — J301 Allergic rhinitis due to pollen: Secondary | ICD-10-CM | POA: Diagnosis not present

## 2024-02-02 DIAGNOSIS — J3089 Other allergic rhinitis: Secondary | ICD-10-CM | POA: Diagnosis not present

## 2024-02-02 DIAGNOSIS — J3081 Allergic rhinitis due to animal (cat) (dog) hair and dander: Secondary | ICD-10-CM | POA: Diagnosis not present

## 2024-02-02 DIAGNOSIS — J301 Allergic rhinitis due to pollen: Secondary | ICD-10-CM | POA: Diagnosis not present

## 2024-02-08 DIAGNOSIS — J301 Allergic rhinitis due to pollen: Secondary | ICD-10-CM | POA: Diagnosis not present

## 2024-02-18 DIAGNOSIS — J3089 Other allergic rhinitis: Secondary | ICD-10-CM | POA: Diagnosis not present

## 2024-02-18 DIAGNOSIS — J3081 Allergic rhinitis due to animal (cat) (dog) hair and dander: Secondary | ICD-10-CM | POA: Diagnosis not present

## 2024-02-18 DIAGNOSIS — J301 Allergic rhinitis due to pollen: Secondary | ICD-10-CM | POA: Diagnosis not present

## 2024-02-23 DIAGNOSIS — J3081 Allergic rhinitis due to animal (cat) (dog) hair and dander: Secondary | ICD-10-CM | POA: Diagnosis not present

## 2024-02-23 DIAGNOSIS — J3089 Other allergic rhinitis: Secondary | ICD-10-CM | POA: Diagnosis not present

## 2024-02-23 DIAGNOSIS — J301 Allergic rhinitis due to pollen: Secondary | ICD-10-CM | POA: Diagnosis not present

## 2024-02-25 ENCOUNTER — Other Ambulatory Visit (HOSPITAL_COMMUNITY): Payer: Self-pay

## 2024-02-25 ENCOUNTER — Other Ambulatory Visit: Payer: Self-pay

## 2024-02-28 ENCOUNTER — Other Ambulatory Visit (HOSPITAL_COMMUNITY): Payer: Self-pay

## 2024-02-28 MED ORDER — MONTELUKAST SODIUM 10 MG PO TABS
10.0000 mg | ORAL_TABLET | Freq: Every day | ORAL | 0 refills | Status: DC
  Filled 2024-02-28 – 2024-03-14 (×2): qty 90, 90d supply, fill #0

## 2024-02-29 DIAGNOSIS — J301 Allergic rhinitis due to pollen: Secondary | ICD-10-CM | POA: Diagnosis not present

## 2024-03-08 DIAGNOSIS — J3081 Allergic rhinitis due to animal (cat) (dog) hair and dander: Secondary | ICD-10-CM | POA: Diagnosis not present

## 2024-03-08 DIAGNOSIS — J3089 Other allergic rhinitis: Secondary | ICD-10-CM | POA: Diagnosis not present

## 2024-03-08 DIAGNOSIS — J301 Allergic rhinitis due to pollen: Secondary | ICD-10-CM | POA: Diagnosis not present

## 2024-03-09 ENCOUNTER — Other Ambulatory Visit (HOSPITAL_COMMUNITY): Payer: Self-pay

## 2024-03-13 ENCOUNTER — Other Ambulatory Visit (HOSPITAL_COMMUNITY): Payer: Self-pay

## 2024-03-14 ENCOUNTER — Other Ambulatory Visit (HOSPITAL_COMMUNITY): Payer: Self-pay

## 2024-03-15 DIAGNOSIS — J3089 Other allergic rhinitis: Secondary | ICD-10-CM | POA: Diagnosis not present

## 2024-03-15 DIAGNOSIS — J301 Allergic rhinitis due to pollen: Secondary | ICD-10-CM | POA: Diagnosis not present

## 2024-03-15 DIAGNOSIS — J3081 Allergic rhinitis due to animal (cat) (dog) hair and dander: Secondary | ICD-10-CM | POA: Diagnosis not present

## 2024-03-16 DIAGNOSIS — K219 Gastro-esophageal reflux disease without esophagitis: Secondary | ICD-10-CM | POA: Diagnosis not present

## 2024-03-16 DIAGNOSIS — E559 Vitamin D deficiency, unspecified: Secondary | ICD-10-CM | POA: Diagnosis not present

## 2024-03-16 DIAGNOSIS — E782 Mixed hyperlipidemia: Secondary | ICD-10-CM | POA: Diagnosis not present

## 2024-03-16 DIAGNOSIS — Z0000 Encounter for general adult medical examination without abnormal findings: Secondary | ICD-10-CM | POA: Diagnosis not present

## 2024-03-16 DIAGNOSIS — J3089 Other allergic rhinitis: Secondary | ICD-10-CM | POA: Diagnosis not present

## 2024-03-24 DIAGNOSIS — J3089 Other allergic rhinitis: Secondary | ICD-10-CM | POA: Diagnosis not present

## 2024-03-24 DIAGNOSIS — J3081 Allergic rhinitis due to animal (cat) (dog) hair and dander: Secondary | ICD-10-CM | POA: Diagnosis not present

## 2024-03-24 DIAGNOSIS — J301 Allergic rhinitis due to pollen: Secondary | ICD-10-CM | POA: Diagnosis not present

## 2024-03-30 DIAGNOSIS — J3089 Other allergic rhinitis: Secondary | ICD-10-CM | POA: Diagnosis not present

## 2024-03-30 DIAGNOSIS — J301 Allergic rhinitis due to pollen: Secondary | ICD-10-CM | POA: Diagnosis not present

## 2024-04-12 DIAGNOSIS — J301 Allergic rhinitis due to pollen: Secondary | ICD-10-CM | POA: Diagnosis not present

## 2024-04-19 ENCOUNTER — Telehealth

## 2024-04-19 ENCOUNTER — Other Ambulatory Visit (HOSPITAL_COMMUNITY): Payer: Self-pay

## 2024-04-19 DIAGNOSIS — J208 Acute bronchitis due to other specified organisms: Secondary | ICD-10-CM

## 2024-04-19 DIAGNOSIS — B9689 Other specified bacterial agents as the cause of diseases classified elsewhere: Secondary | ICD-10-CM | POA: Diagnosis not present

## 2024-04-19 MED ORDER — PREDNISONE 10 MG (21) PO TBPK
ORAL_TABLET | ORAL | 0 refills | Status: AC
Start: 2024-04-19 — End: ?
  Filled 2024-04-19: qty 21, 6d supply, fill #0

## 2024-04-19 MED ORDER — PROMETHAZINE-DM 6.25-15 MG/5ML PO SYRP
5.0000 mL | ORAL_SOLUTION | Freq: Four times a day (QID) | ORAL | 0 refills | Status: AC | PRN
Start: 2024-04-19 — End: ?
  Filled 2024-04-19: qty 118, 6d supply, fill #0

## 2024-04-19 MED ORDER — AZITHROMYCIN 250 MG PO TABS
ORAL_TABLET | ORAL | 0 refills | Status: AC
Start: 2024-04-19 — End: 2024-04-24
  Filled 2024-04-19: qty 6, 5d supply, fill #0

## 2024-04-19 NOTE — Progress Notes (Signed)
 Virtual Visit Consent   [name], you are scheduled for a virtual visit with a [facility] provider today. Just as with appointments in the office, your consent must be obtained to participate. Your consent will be active for this visit and any virtual visit you may have with one of our providers in the next 365 days. If you have a MyChart account, a copy of this consent can be sent to you electronically.  As this is a virtual visit, video technology does not allow for your provider to perform a traditional examination. This may limit your provider's ability to fully assess your condition. If your provider identifies any concerns that need to be evaluated in person or the need to arrange testing (such as labs, EKG, etc.), we will make arrangements to do so. Although advances in technology are sophisticated, we cannot ensure that it will always work on either your end or our end. If the connection with a video visit is poor, the visit may have to be switched to a telephone visit. With either a video or telephone visit, we are not always able to ensure that we have a secure connection.  By engaging in this virtual visit, you consent to the provision of healthcare and authorize for your insurance to be billed (if applicable) for the services provided during this visit. Depending on your insurance coverage, you may receive a charge related to this service.  I need to obtain your verbal consent now. Are you willing to proceed with your visit today? Bernhard J Cripps has provided verbal consent on 04/19/2024 for a virtual visit (video or telephone). Lanetta Pion, NP  Date: 04/19/2024 9:09 AM   Virtual Visit via Video Note   I, Lanetta Pion, connected with  George Nguyen  ([id number], [birth year]) on 04/19/24 at  9:15 AM EDT by a video-enabled telemedicine application and verified that I am speaking with the correct person using two identifiers.  Location: Patient: Virtual Visit Location Patient:  Home Provider: Virtual Visit Location Provider: Home Office   I discussed the limitations of evaluation and management by telemedicine and the availability of in person appointments. The patient expressed understanding and agreed to proceed.    History of Present Illness: George Nguyen is a 38 y.o. who identifies as a male who was assigned male at birth, and is being seen today for a persistent cough.  A few weeks ago noted some cough and congestion. Lasted for half a week or so. Then last week he started with a scratchy throat and some congestion- thick brown and tan looking mucus when he can get anything up. Otherwise dry cough- mostly during the day. Felt feverish at one point-that improved. Chest tightness and some shortness of breath at times- has inhaler from allergist, as he is getting allergy shots (currently on hold since he is sick). Reports body aches last night but tylenol  helped this, none noted today.  Is on singular and xyzal  for allergies and has been using day quil- and nyquil to help symptoms will limited improvement.  Denies chest pain, fever, chills, headaches, or sinus pressure.  Problems:  Patient Active Problem List   Diagnosis Date Noted   S/P tonsillectomy 07/06/2014   Chronic appendicitis 08/26/2012    Allergies: No Known Allergies Medications:  Current Outpatient Medications:    acetaminophen  (TYLENOL ) 500 MG tablet, Take 1 tablet (500 mg total) by mouth every 6 (six) hours as needed., Disp: 30 tablet, Rfl: 0   azelastine  (  OPTIVAR ) 0.05 % ophthalmic solution, Instill 1 drop into affected eye 2 (two) times daily., Disp: 6 mL, Rfl: 3   Azelastine -Fluticasone  137-50 MCG/ACT SUSP, Place 1 spray into both nostrils 2 (two) times daily., Disp: 23 g, Rfl: 0   benzonatate  (TESSALON ) 200 MG capsule, Take 1 capsule (200 mg total) by mouth 3 (three) times daily., Disp: 42 capsule, Rfl: 0   COVID-19 mRNA bivalent vaccine, Pfizer, (PFIZER COVID-19 VAC BIVALENT) injection,  Inject into the muscle., Disp: 0.3 mL, Rfl: 0   cyclobenzaprine  (FLEXERIL ) 10 MG tablet, Take 1 tablet (10 mg total) by mouth up to  3 (three) times daily as needed for muscle relaxation( best at bedtime), Disp: 30 tablet, Rfl: 0   EPINEPHrine  0.3 mg/0.3 mL IJ SOAJ injection, Inject as directed as needed, Disp: 2 each, Rfl: 1   fluconazole  (DIFLUCAN ) 200 MG tablet, Take 1 tablet (200 mg total) by mouth once a week., Disp: 4 tablet, Rfl: 0   fluticasone  (FLONASE ) 50 MCG/ACT nasal spray, Place 1-2 sprays into both nostrils daily., Disp: 16 g, Rfl: 3   ibuprofen  (ADVIL ) 600 MG tablet, Take 1 tablet (600 mg total) by mouth every 8 (eight) hours as needed for moderate pain., Disp: 30 tablet, Rfl: 0   levocetirizine (XYZAL ) 5 MG tablet, TAKE 1 TABLET BY MOUTH DAILY IN THE EVENING, Disp: 90 tablet, Rfl: 3   levocetirizine (XYZAL ) 5 MG tablet, Take 1 tablet (5 mg total) by mouth every evening., Disp: 30 tablet, Rfl: 3   levocetirizine (XYZAL ) 5 MG tablet, Take 1 tablet (5 mg total) by mouth every evening., Disp: 90 tablet, Rfl: 3   meloxicam  (MOBIC ) 15 MG tablet, Take 1 tablet (15 mg total) by mouth daily., Disp: 30 tablet, Rfl: 1   meloxicam  (MOBIC ) 15 MG tablet, Take 1 tablet (15 mg total) by mouth daily., Disp: 30 tablet, Rfl: 0   montelukast  (SINGULAIR ) 10 MG tablet, TAKE 1 TABLET BY MOUTH ONCE A DAY, Disp: 90 tablet, Rfl: 3   montelukast  (SINGULAIR ) 10 MG tablet, Take 1 tablet (10 mg total) by mouth daily., Disp: 90 tablet, Rfl: 0   montelukast  (SINGULAIR ) 10 MG tablet, Take 1 tablet (10 mg total) by mouth daily., Disp: 90 tablet, Rfl: 0   omeprazole  (PRILOSEC) 40 MG capsule, Take 1 capsule (40 mg total) by mouth daily., Disp: 90 capsule, Rfl: 2   predniSONE  (DELTASONE ) 20 MG tablet, Take 2 tablets (40 mg total) by mouth daily., Disp: 8 tablet, Rfl: 0   scopolamine  (TRANSDERM-SCOP) 1 MG/3DAYS, Place 1 patch (1.5 mg total) onto the skin behind the ear every 3 (three) days., Disp: 5 patch, Rfl: 0    valACYclovir  (VALTREX ) 1000 MG tablet, Take 1 tablet (1,000 mg total) by mouth 3 (three) times daily., Disp: 21 tablet, Rfl: 0  Observations/Objective: Patient is well-developed, well-nourished in no acute distress.  Resting comfortably  at home.  Head is normocephalic, atraumatic.  No labored breathing.  Speech is clear and coherent with logical content.  Patient is alert and oriented at baseline.  Cough noted during visit   Assessment and Plan:  1. Acute bacterial bronchitis (Primary)  - azithromycin  (ZITHROMAX ) 250 MG tablet; Take 2 tablets on day 1, then 1 tablet daily on days 2 through 5  Dispense: 6 tablet; Refill: 0 - promethazine -dextromethorphan (PROMETHAZINE -DM) 6.25-15 MG/5ML syrup; Take 5 mLs by mouth 4 (four) times daily as needed for cough.  Dispense: 118 mL; Refill: 0 - predniSONE  (STERAPRED UNI-PAK 21 TAB) 10 MG (21) TBPK tablet; Take as  directed  Dispense: 21 tablet; Refill: 0  - Take meds as prescribed- cough syrup can make you sleepy, do not use with other OTC cough meds.  - Rest voice - Use a cool mist humidifier especially during the winter months when heat dries out the air. - Use saline nose sprays frequently to help soothe nasal passages if they are drying out. - Stay hydrated by drinking plenty of fluids - Keep thermostat turn down low to prevent drying out which can cause a dry cough. - For fever or aches or pains- take tylenol  or ibuprofen  as directed on bottle             * for fevers greater than 101 orally you may alternate ibuprofen  and tylenol  every 3 hours.  If you do not improve you will need a follow up visit in person.   Reviewed side effects, risks and benefits of medication.    Patient acknowledged agreement and understanding of the plan.   Past Medical, Surgical, Social History, Allergies, and Medications have been Reviewed.    Follow Up Instructions: I discussed the assessment and treatment plan with the patient. The patient was provided  an opportunity to ask questions and all were answered. The patient agreed with the plan and demonstrated an understanding of the instructions.  A copy of instructions were sent to the patient via MyChart unless otherwise noted below.    The patient was advised to call back or seek an in-person evaluation if the symptoms worsen or if the condition fails to improve as anticipated.    Lanetta Pion, NP

## 2024-04-19 NOTE — Patient Instructions (Addendum)
 George Nguyen, thank you for joining George Pion, NP for today's virtual visit.  While this provider is not your primary care provider (PCP), if your PCP is located in our provider database this encounter information will be shared with them immediately following your visit.   A George Nguyen gives you access to today's visit and all your visits, tests, and labs performed at [facility] " click here if you don't have a [facility] MyChart Nguyen or go to [email or web address]  Consent: (Patient) George Nguyen provided verbal consent for this virtual visit at the beginning of the encounter.  Current Medications:  Current Outpatient Medications:    azithromycin  (ZITHROMAX ) 250 MG tablet, Take 2 tablets on day 1, then 1 tablet daily on days 2 through 5, Disp: 6 tablet, Rfl: 0   predniSONE  (STERAPRED UNI-PAK 21 TAB) 10 MG (21) TBPK tablet, Take as directed, Disp: 21 tablet, Rfl: 0   promethazine -dextromethorphan (PROMETHAZINE -DM) 6.25-15 MG/5ML syrup, Take 5 mLs by mouth 4 (four) times daily as needed for cough., Disp: 118 mL, Rfl: 0   acetaminophen  (TYLENOL ) 500 MG tablet, Take 1 tablet (500 mg total) by mouth every 6 (six) hours as needed., Disp: 30 tablet, Rfl: 0   azelastine  (OPTIVAR ) 0.05 % ophthalmic solution, Instill 1 drop into affected eye 2 (two) times daily., Disp: 6 mL, Rfl: 3   Azelastine -Fluticasone  137-50 MCG/ACT SUSP, Place 1 spray into both nostrils 2 (two) times daily., Disp: 23 g, Rfl: 0   benzonatate  (TESSALON ) 200 MG capsule, Take 1 capsule (200 mg total) by mouth 3 (three) times daily., Disp: 42 capsule, Rfl: 0   COVID-19 mRNA bivalent vaccine, Pfizer, (PFIZER COVID-19 VAC BIVALENT) injection, Inject into the muscle., Disp: 0.3 mL, Rfl: 0   cyclobenzaprine  (FLEXERIL ) 10 MG tablet, Take 1 tablet (10 mg total) by mouth up to  3 (three) times daily as needed for muscle relaxation( best at bedtime), Disp: 30 tablet, Rfl: 0   EPINEPHrine  0.3  mg/0.3 mL IJ SOAJ injection, Inject as directed as needed, Disp: 2 each, Rfl: 1   fluconazole  (DIFLUCAN ) 200 MG tablet, Take 1 tablet (200 mg total) by mouth once a week., Disp: 4 tablet, Rfl: 0   fluticasone  (FLONASE ) 50 MCG/ACT nasal spray, Place 1-2 sprays into both nostrils daily., Disp: 16 g, Rfl: 3   ibuprofen  (ADVIL ) 600 MG tablet, Take 1 tablet (600 mg total) by mouth every 8 (eight) hours as needed for moderate pain., Disp: 30 tablet, Rfl: 0   levocetirizine (XYZAL ) 5 MG tablet, TAKE 1 TABLET BY MOUTH DAILY IN THE EVENING, Disp: 90 tablet, Rfl: 3   levocetirizine (XYZAL ) 5 MG tablet, Take 1 tablet (5 mg total) by mouth every evening., Disp: 30 tablet, Rfl: 3   levocetirizine (XYZAL ) 5 MG tablet, Take 1 tablet (5 mg total) by mouth every evening., Disp: 90 tablet, Rfl: 3   meloxicam  (MOBIC ) 15 MG tablet, Take 1 tablet (15 mg total) by mouth daily., Disp: 30 tablet, Rfl: 1   meloxicam  (MOBIC ) 15 MG tablet, Take 1 tablet (15 mg total) by mouth daily., Disp: 30 tablet, Rfl: 0   montelukast  (SINGULAIR ) 10 MG tablet, TAKE 1 TABLET BY MOUTH ONCE A DAY, Disp: 90 tablet, Rfl: 3   montelukast  (SINGULAIR ) 10 MG tablet, Take 1 tablet (10 mg total) by mouth daily., Disp: 90 tablet, Rfl: 0   montelukast  (SINGULAIR ) 10 MG tablet, Take 1 tablet (10 mg total) by mouth daily., Disp: 90 tablet, Rfl: 0   omeprazole  (  PRILOSEC) 40 MG capsule, Take 1 capsule (40 mg total) by mouth daily., Disp: 90 capsule, Rfl: 2   scopolamine  (TRANSDERM-SCOP) 1 MG/3DAYS, Place 1 patch (1.5 mg total) onto the skin behind the ear every 3 (three) days., Disp: 5 patch, Rfl: 0   valACYclovir  (VALTREX ) 1000 MG tablet, Take 1 tablet (1,000 mg total) by mouth 3 (three) times daily., Disp: 21 tablet, Rfl: 0   Medications ordered in this encounter:  Meds ordered this encounter  Medications   azithromycin  (ZITHROMAX ) 250 MG tablet    Sig: Take 2 tablets on day 1, then 1 tablet daily on days 2 through 5    Dispense:  6 tablet     Refill:  0    Supervising Provider:   LAMPTEY, PHILIP O [[id number]]   promethazine -dextromethorphan (PROMETHAZINE -DM) 6.25-15 MG/5ML syrup    Sig: Take 5 mLs by mouth 4 (four) times daily as needed for cough.    Dispense:  118 mL    Refill:  0    Supervising Provider:   Corine Dice [[id number]]   predniSONE  (STERAPRED UNI-PAK 21 TAB) 10 MG (21) TBPK tablet    Sig: Take as directed    Dispense:  21 tablet    Refill:  0    Supervising Provider:   Corine Dice [[id number]]     *If you need refills on other medications prior to your next appointment, please contact your pharmacy*  Follow-Up: Call back or seek an in-person evaluation if the symptoms worsen or if the condition fails to improve as anticipated.  [facility] [phone]  Other Instructions  - Take meds as prescribed- cough syrup can make you sleepy, do not use with other OTC cough meds.  - Rest voice - Use a cool mist humidifier especially during the winter months when heat dries out the air. - Use saline nose sprays frequently to help soothe nasal passages if they are drying out. - Stay hydrated by drinking plenty of fluids - Keep thermostat turn down low to prevent drying out which can cause a dry cough. - For fever or aches or pains- take tylenol  or ibuprofen  as directed on bottle             * for fevers greater than 101 orally you may alternate ibuprofen  and tylenol  every 3 hours.  If you do not improve you will need a follow up visit in person.   Acute Bronchitis, Adult  Acute bronchitis is when air tubes in the lungs (bronchi) suddenly get swollen. The condition can make it hard for you to breathe. In adults, acute bronchitis usually goes away within 2 weeks. A cough caused by bronchitis may last up to 3 weeks. Smoking, allergies, and asthma can make the condition worse. What are the causes? Germs that cause cold and flu (viruses). The most common cause of this condition is the virus that  causes the common cold. Bacteria. Substances that bother (irritate) the lungs, including: Smoke from cigarettes and other types of tobacco. Dust and pollen. Fumes from chemicals, gases, or burned fuel. Indoor or outdoor air pollution. What increases the risk? A weak body's defense system. This is also called the immune system. Any condition that affects your lungs and breathing, such as asthma. What are the signs or symptoms? A cough. Coughing up clear, yellow, or green mucus. Making high-pitched whistling sounds when you breathe, most often when you breathe out (wheezing). Runny or stuffy nose. Having too much mucus in your lungs (  chest congestion). Shortness of breath. Body aches. A sore throat. How is this treated? Acute bronchitis may go away over time without treatment. Your doctor may tell you to: Drink more fluids. This will help thin your mucus so it is easier to cough up. Use a device that gets medicine into your lungs (inhaler). Use a vaporizer or a humidifier. These are machines that add water to the air. This helps with coughing and poor breathing. Take a medicine that thins mucus and helps clear it from your lungs. Take a medicine that prevents or stops coughing. It is not common to take an antibiotic medicine for this condition. Follow these instructions at home:  Take over-the-counter and prescription medicines only as told by your doctor. Use an inhaler, vaporizer, or humidifier as told by your doctor. Take two teaspoons (10 mL) of honey at bedtime. This helps lessen your coughing at night. Drink enough fluid to keep your pee (urine) pale yellow. Do not smoke or use any products that contain nicotine or tobacco. If you need help quitting, ask your doctor. Get a lot of rest. Return to your normal activities when your doctor says that it is safe. Keep all follow-up visits. How is this prevented?  Wash your hands often with soap and water for at least 20 seconds.  If you cannot use soap and water, use hand sanitizer. Avoid contact with people who have cold symptoms. Try not to touch your mouth, nose, or eyes with your hands. Avoid breathing in smoke or chemical fumes. Make sure to get the flu shot every year. Contact a doctor if: Your symptoms do not get better in 2 weeks. You have trouble coughing up the mucus. Your cough keeps you awake at night. You have a fever. Get help right away if: You cough up blood. You have chest pain. You have very bad shortness of breath. You faint or keep feeling like you are going to faint. You have a very bad headache. Your fever or chills get worse. These symptoms may be an emergency. Get help right away. Call your local emergency services (911 in the [place] Do not wait to see if the symptoms will go away. Do not drive yourself to the hospital. Summary Acute bronchitis is when air tubes in the lungs (bronchi) suddenly get swollen. In adults, acute bronchitis usually goes away within 2 weeks. Drink more fluids. This will help thin your mucus so it is easier to cough up. Take over-the-counter and prescription medicines only as told by your doctor. Contact a doctor if your symptoms do not improve after 2 weeks of treatment. This information is not intended to replace advice given to you by your health care provider. Make sure you discuss any questions you have with your health care provider. Document Revised: 04/09/2021 Document Reviewed: 04/09/2021 Elsevier Patient Education  2024 Elsevier Inc.   If you have been instructed to have an in-person evaluation today at a local Urgent Care facility, please use the link below. It will take you to a list of all of our available [facility], including address, phone number and hours of operation. Please do not delay care.  [facility]  If you or a family member do not have a primary care provider, use the link below to schedule a visit and  establish care. When you choose a [facility] primary care physician or advanced practice provider, you gain a long-term partner in health. Find a Primary Care Provider  Learn  more about [facility] in-office and virtual care options: [facility] - Get Care Now

## 2024-05-02 ENCOUNTER — Other Ambulatory Visit (HOSPITAL_COMMUNITY): Payer: Self-pay

## 2024-05-02 DIAGNOSIS — J301 Allergic rhinitis due to pollen: Secondary | ICD-10-CM | POA: Diagnosis not present

## 2024-05-02 MED ORDER — LEVOCETIRIZINE DIHYDROCHLORIDE 5 MG PO TABS
5.0000 mg | ORAL_TABLET | Freq: Every evening | ORAL | 1 refills | Status: AC
  Filled 2024-05-02 – 2024-11-29 (×2): qty 90, 90d supply, fill #0

## 2024-05-17 DIAGNOSIS — J3081 Allergic rhinitis due to animal (cat) (dog) hair and dander: Secondary | ICD-10-CM | POA: Diagnosis not present

## 2024-05-17 DIAGNOSIS — J301 Allergic rhinitis due to pollen: Secondary | ICD-10-CM | POA: Diagnosis not present

## 2024-05-17 DIAGNOSIS — J3089 Other allergic rhinitis: Secondary | ICD-10-CM | POA: Diagnosis not present

## 2024-05-22 DIAGNOSIS — J3081 Allergic rhinitis due to animal (cat) (dog) hair and dander: Secondary | ICD-10-CM | POA: Diagnosis not present

## 2024-05-22 DIAGNOSIS — J3089 Other allergic rhinitis: Secondary | ICD-10-CM | POA: Diagnosis not present

## 2024-05-22 DIAGNOSIS — J301 Allergic rhinitis due to pollen: Secondary | ICD-10-CM | POA: Diagnosis not present

## 2024-05-31 DIAGNOSIS — J301 Allergic rhinitis due to pollen: Secondary | ICD-10-CM | POA: Diagnosis not present

## 2024-05-31 DIAGNOSIS — J3081 Allergic rhinitis due to animal (cat) (dog) hair and dander: Secondary | ICD-10-CM | POA: Diagnosis not present

## 2024-05-31 DIAGNOSIS — J3089 Other allergic rhinitis: Secondary | ICD-10-CM | POA: Diagnosis not present

## 2024-06-06 DIAGNOSIS — J3081 Allergic rhinitis due to animal (cat) (dog) hair and dander: Secondary | ICD-10-CM | POA: Diagnosis not present

## 2024-06-06 DIAGNOSIS — J3089 Other allergic rhinitis: Secondary | ICD-10-CM | POA: Diagnosis not present

## 2024-06-06 DIAGNOSIS — J301 Allergic rhinitis due to pollen: Secondary | ICD-10-CM | POA: Diagnosis not present

## 2024-06-12 DIAGNOSIS — J3081 Allergic rhinitis due to animal (cat) (dog) hair and dander: Secondary | ICD-10-CM | POA: Diagnosis not present

## 2024-06-12 DIAGNOSIS — J3089 Other allergic rhinitis: Secondary | ICD-10-CM | POA: Diagnosis not present

## 2024-06-12 DIAGNOSIS — J301 Allergic rhinitis due to pollen: Secondary | ICD-10-CM | POA: Diagnosis not present

## 2024-06-22 DIAGNOSIS — J301 Allergic rhinitis due to pollen: Secondary | ICD-10-CM | POA: Diagnosis not present

## 2024-06-22 DIAGNOSIS — J3089 Other allergic rhinitis: Secondary | ICD-10-CM | POA: Diagnosis not present

## 2024-06-22 DIAGNOSIS — J3081 Allergic rhinitis due to animal (cat) (dog) hair and dander: Secondary | ICD-10-CM | POA: Diagnosis not present

## 2024-06-25 ENCOUNTER — Other Ambulatory Visit (HOSPITAL_COMMUNITY): Payer: Self-pay

## 2024-06-26 ENCOUNTER — Other Ambulatory Visit (HOSPITAL_COMMUNITY): Payer: Self-pay

## 2024-06-26 MED ORDER — OMEPRAZOLE 40 MG PO CPDR
40.0000 mg | DELAYED_RELEASE_CAPSULE | ORAL | 0 refills | Status: AC
  Filled 2024-06-26: qty 90, 90d supply, fill #0

## 2024-06-26 MED ORDER — MONTELUKAST SODIUM 10 MG PO TABS
10.0000 mg | ORAL_TABLET | Freq: Every day | ORAL | 1 refills | Status: AC
  Filled 2024-06-26: qty 90, 90d supply, fill #0
  Filled 2024-11-29: qty 90, 90d supply, fill #1

## 2024-06-28 DIAGNOSIS — J3089 Other allergic rhinitis: Secondary | ICD-10-CM | POA: Diagnosis not present

## 2024-06-28 DIAGNOSIS — J3081 Allergic rhinitis due to animal (cat) (dog) hair and dander: Secondary | ICD-10-CM | POA: Diagnosis not present

## 2024-06-28 DIAGNOSIS — J301 Allergic rhinitis due to pollen: Secondary | ICD-10-CM | POA: Diagnosis not present

## 2024-07-05 DIAGNOSIS — J3089 Other allergic rhinitis: Secondary | ICD-10-CM | POA: Diagnosis not present

## 2024-07-05 DIAGNOSIS — J301 Allergic rhinitis due to pollen: Secondary | ICD-10-CM | POA: Diagnosis not present

## 2024-07-05 DIAGNOSIS — J3081 Allergic rhinitis due to animal (cat) (dog) hair and dander: Secondary | ICD-10-CM | POA: Diagnosis not present

## 2024-07-12 DIAGNOSIS — J3081 Allergic rhinitis due to animal (cat) (dog) hair and dander: Secondary | ICD-10-CM | POA: Diagnosis not present

## 2024-07-12 DIAGNOSIS — J3089 Other allergic rhinitis: Secondary | ICD-10-CM | POA: Diagnosis not present

## 2024-07-12 DIAGNOSIS — J301 Allergic rhinitis due to pollen: Secondary | ICD-10-CM | POA: Diagnosis not present

## 2024-07-19 DIAGNOSIS — J301 Allergic rhinitis due to pollen: Secondary | ICD-10-CM | POA: Diagnosis not present

## 2024-07-19 DIAGNOSIS — J3081 Allergic rhinitis due to animal (cat) (dog) hair and dander: Secondary | ICD-10-CM | POA: Diagnosis not present

## 2024-07-19 DIAGNOSIS — J3089 Other allergic rhinitis: Secondary | ICD-10-CM | POA: Diagnosis not present

## 2024-08-01 DIAGNOSIS — J3081 Allergic rhinitis due to animal (cat) (dog) hair and dander: Secondary | ICD-10-CM | POA: Diagnosis not present

## 2024-08-01 DIAGNOSIS — J301 Allergic rhinitis due to pollen: Secondary | ICD-10-CM | POA: Diagnosis not present

## 2024-08-01 DIAGNOSIS — J3089 Other allergic rhinitis: Secondary | ICD-10-CM | POA: Diagnosis not present

## 2024-08-09 DIAGNOSIS — J3089 Other allergic rhinitis: Secondary | ICD-10-CM | POA: Diagnosis not present

## 2024-08-16 DIAGNOSIS — J3081 Allergic rhinitis due to animal (cat) (dog) hair and dander: Secondary | ICD-10-CM | POA: Diagnosis not present

## 2024-08-16 DIAGNOSIS — J3089 Other allergic rhinitis: Secondary | ICD-10-CM | POA: Diagnosis not present

## 2024-08-16 DIAGNOSIS — J301 Allergic rhinitis due to pollen: Secondary | ICD-10-CM | POA: Diagnosis not present

## 2024-08-25 DIAGNOSIS — J301 Allergic rhinitis due to pollen: Secondary | ICD-10-CM | POA: Diagnosis not present

## 2024-08-25 DIAGNOSIS — J3089 Other allergic rhinitis: Secondary | ICD-10-CM | POA: Diagnosis not present

## 2024-08-25 DIAGNOSIS — J3081 Allergic rhinitis due to animal (cat) (dog) hair and dander: Secondary | ICD-10-CM | POA: Diagnosis not present

## 2024-08-30 ENCOUNTER — Other Ambulatory Visit (HOSPITAL_COMMUNITY): Payer: Self-pay

## 2024-08-30 DIAGNOSIS — J301 Allergic rhinitis due to pollen: Secondary | ICD-10-CM | POA: Diagnosis not present

## 2024-08-30 MED ORDER — EPINEPHRINE 0.3 MG/0.3ML IJ SOAJ
0.3000 mg | INTRAMUSCULAR | 1 refills | Status: AC | PRN
  Filled 2024-08-30: qty 2, 15d supply, fill #0

## 2024-08-31 ENCOUNTER — Other Ambulatory Visit (HOSPITAL_COMMUNITY): Payer: Self-pay

## 2024-09-15 DIAGNOSIS — J3081 Allergic rhinitis due to animal (cat) (dog) hair and dander: Secondary | ICD-10-CM | POA: Diagnosis not present

## 2024-09-15 DIAGNOSIS — J3089 Other allergic rhinitis: Secondary | ICD-10-CM | POA: Diagnosis not present

## 2024-09-15 DIAGNOSIS — J301 Allergic rhinitis due to pollen: Secondary | ICD-10-CM | POA: Diagnosis not present

## 2024-09-26 DIAGNOSIS — H1045 Other chronic allergic conjunctivitis: Secondary | ICD-10-CM | POA: Diagnosis not present

## 2024-09-26 DIAGNOSIS — J3089 Other allergic rhinitis: Secondary | ICD-10-CM | POA: Diagnosis not present

## 2024-09-26 DIAGNOSIS — J3081 Allergic rhinitis due to animal (cat) (dog) hair and dander: Secondary | ICD-10-CM | POA: Diagnosis not present

## 2024-09-26 DIAGNOSIS — J301 Allergic rhinitis due to pollen: Secondary | ICD-10-CM | POA: Diagnosis not present

## 2024-10-03 DIAGNOSIS — J301 Allergic rhinitis due to pollen: Secondary | ICD-10-CM | POA: Diagnosis not present

## 2024-10-03 DIAGNOSIS — J3081 Allergic rhinitis due to animal (cat) (dog) hair and dander: Secondary | ICD-10-CM | POA: Diagnosis not present

## 2024-10-03 DIAGNOSIS — J3089 Other allergic rhinitis: Secondary | ICD-10-CM | POA: Diagnosis not present

## 2024-10-11 DIAGNOSIS — J3089 Other allergic rhinitis: Secondary | ICD-10-CM | POA: Diagnosis not present

## 2024-10-11 DIAGNOSIS — J3081 Allergic rhinitis due to animal (cat) (dog) hair and dander: Secondary | ICD-10-CM | POA: Diagnosis not present

## 2024-10-11 DIAGNOSIS — J301 Allergic rhinitis due to pollen: Secondary | ICD-10-CM | POA: Diagnosis not present

## 2024-10-16 ENCOUNTER — Other Ambulatory Visit (HOSPITAL_BASED_OUTPATIENT_CLINIC_OR_DEPARTMENT_OTHER): Payer: Self-pay

## 2024-10-16 MED ORDER — FLUZONE 0.5 ML IM SUSY
0.5000 mL | PREFILLED_SYRINGE | Freq: Once | INTRAMUSCULAR | 0 refills | Status: AC
  Filled 2024-10-16: qty 0.5, 1d supply, fill #0

## 2024-10-17 DIAGNOSIS — J3089 Other allergic rhinitis: Secondary | ICD-10-CM | POA: Diagnosis not present

## 2024-10-17 DIAGNOSIS — J3081 Allergic rhinitis due to animal (cat) (dog) hair and dander: Secondary | ICD-10-CM | POA: Diagnosis not present

## 2024-10-17 DIAGNOSIS — J301 Allergic rhinitis due to pollen: Secondary | ICD-10-CM | POA: Diagnosis not present

## 2024-10-27 DIAGNOSIS — J3089 Other allergic rhinitis: Secondary | ICD-10-CM | POA: Diagnosis not present

## 2024-10-27 DIAGNOSIS — J3081 Allergic rhinitis due to animal (cat) (dog) hair and dander: Secondary | ICD-10-CM | POA: Diagnosis not present

## 2024-10-27 DIAGNOSIS — J301 Allergic rhinitis due to pollen: Secondary | ICD-10-CM | POA: Diagnosis not present

## 2024-11-03 DIAGNOSIS — J301 Allergic rhinitis due to pollen: Secondary | ICD-10-CM | POA: Diagnosis not present

## 2024-11-07 DIAGNOSIS — J3081 Allergic rhinitis due to animal (cat) (dog) hair and dander: Secondary | ICD-10-CM | POA: Diagnosis not present

## 2024-11-07 DIAGNOSIS — J3089 Other allergic rhinitis: Secondary | ICD-10-CM | POA: Diagnosis not present

## 2024-11-07 DIAGNOSIS — J301 Allergic rhinitis due to pollen: Secondary | ICD-10-CM | POA: Diagnosis not present

## 2024-11-21 DIAGNOSIS — J301 Allergic rhinitis due to pollen: Secondary | ICD-10-CM | POA: Diagnosis not present

## 2024-11-29 DIAGNOSIS — J3081 Allergic rhinitis due to animal (cat) (dog) hair and dander: Secondary | ICD-10-CM | POA: Diagnosis not present

## 2024-11-29 DIAGNOSIS — J301 Allergic rhinitis due to pollen: Secondary | ICD-10-CM | POA: Diagnosis not present

## 2024-11-29 DIAGNOSIS — J3089 Other allergic rhinitis: Secondary | ICD-10-CM | POA: Diagnosis not present

## 2024-11-30 ENCOUNTER — Other Ambulatory Visit (HOSPITAL_COMMUNITY): Payer: Self-pay

## 2024-12-20 ENCOUNTER — Other Ambulatory Visit (HOSPITAL_COMMUNITY): Payer: Self-pay

## 2024-12-20 ENCOUNTER — Ambulatory Visit
Admission: RE | Admit: 2024-12-20 | Discharge: 2024-12-20 | Disposition: A | Discharge status: Home / Self Care | Source: Ambulatory Visit

## 2024-12-20 VITALS — BP 121/87 | HR 84 | Temp 98.3°F | Resp 16 | Wt 159.0 lb

## 2024-12-20 DIAGNOSIS — J301 Allergic rhinitis due to pollen: Secondary | ICD-10-CM | POA: Insufficient documentation

## 2024-12-20 DIAGNOSIS — N451 Epididymitis: Secondary | ICD-10-CM | POA: Diagnosis not present

## 2024-12-20 DIAGNOSIS — J3081 Allergic rhinitis due to animal (cat) (dog) hair and dander: Secondary | ICD-10-CM | POA: Insufficient documentation

## 2024-12-20 DIAGNOSIS — H1045 Other chronic allergic conjunctivitis: Secondary | ICD-10-CM | POA: Insufficient documentation

## 2024-12-20 DIAGNOSIS — J309 Allergic rhinitis, unspecified: Secondary | ICD-10-CM | POA: Insufficient documentation

## 2024-12-20 LAB — POCT URINE DIPSTICK
Bilirubin, UA: NEGATIVE
Blood, UA: NEGATIVE
Glucose, UA: NEGATIVE mg/dL
Ketones, POC UA: NEGATIVE mg/dL
Leukocytes, UA: NEGATIVE
Nitrite, UA: NEGATIVE
POC PROTEIN,UA: NEGATIVE
Spec Grav, UA: 1.02
Urobilinogen, UA: 0.2 U/dL
pH, UA: 7.5

## 2024-12-20 MED ORDER — CEFTRIAXONE SODIUM 500 MG IJ SOLR
500.0000 mg | Freq: Once | INTRAMUSCULAR | Status: AC
  Administered 2024-12-20: 500 mg via INTRAMUSCULAR

## 2024-12-20 MED ORDER — DOXYCYCLINE HYCLATE 100 MG PO CAPS
100.0000 mg | ORAL_CAPSULE | Freq: Two times a day (BID) | ORAL | 0 refills | Status: AC
  Filled 2024-12-20: qty 14, 7d supply, fill #0

## 2024-12-20 NOTE — Discharge Instructions (Addendum)
 If symptoms do not improve we need to follow-up with urology

## 2024-12-20 NOTE — ED Triage Notes (Signed)
 Pt presents c/o Testicle pain x 2 days. Pt states,  So I've been having this uncomfortable feeling in my testicles. It's like pressure. It started two days ago. I was just laying down watching TV. It feels like pressure and also like a burning sensation.  Pt denies completing any strenuous activities.

## 2024-12-20 NOTE — ED Provider Notes (Signed)
 " George Nguyen    CSN: [id number] Arrival date & time: 12/20/24  9070      History   Chief Complaint Chief Complaint  Patient presents with   Testicle Pain    Burning sensation - Entered by patient    HPI George Nguyen is a 38 y.o. male.   Pt presents today due to testicular pain and burning sensation for 2 days. Pt denies concerns about STIs as he is married and only has sex with wife. Pt states that wife recently has had vaginal irritation as well. Pt is also seeing penile discharge. Pt would like STI testing for peace of mind.   The history is provided by the patient.  Testicle Pain    Past Medical History:  Diagnosis Date   GERD (gastroesophageal reflux disease)    OTC meds   Tuberculosis 2011   completed INH    Patient Active Problem List   Diagnosis Date Noted   Allergic rhinitis 12/20/2024   Allergic rhinitis due to animal hair and dander 12/20/2024   Allergic rhinitis due to pollen 12/20/2024   Chronic allergic conjunctivitis 12/20/2024   S/P tonsillectomy 07/06/2014   Chronic appendicitis 08/26/2012    Past Surgical History:  Procedure Laterality Date   APPENDECTOMY  2009   TONSILLECTOMY Bilateral 07/06/2014   Procedure: TONSILLECTOMY;  Surgeon: Ida Loader, MD;  Location: [facility] OR;  Service: ENT;  Laterality: Bilateral;       Home Medications    Prior to Admission medications  Medication Sig Start Date End Date Taking? Authorizing Provider  acetaminophen  (TYLENOL ) 325 MG tablet Take 650 mg by mouth every 6 (six) hours as needed.   Yes [provider]  cetirizine (ZYRTEC ALLERGY) 10 MG tablet 1 tablet Orally Once a day; Duration: 30 day(s) 03/24/23  Yes [provider]  doxycycline (VIBRAMYCIN) 100 MG capsule Take 1 capsule (100 mg total) by mouth 2 (two) times daily for 7 days. 12/20/24 12/27/24 Yes Andra Krabbe C, PA-C  azelastine  (OPTIVAR ) 0.05 % ophthalmic solution Instill 1 drop into affected eye 2 (two) times  daily. 03/24/23     Azelastine -Fluticasone  137-50 MCG/ACT SUSP Place 1 spray into both nostrils 2 (two) times daily. 11/20/22     EPINEPHrine  0.3 mg/0.3 mL IJ SOAJ injection Inject as directed as needed 03/24/23     EPINEPHrine  0.3 mg/0.3 mL IJ SOAJ injection Inject 0.3 mg into the muscle as needed for severe allergic reactions. 08/30/24     fexofenadine (ALLEGRA) 60 MG tablet Take 60 mg by mouth.    [provider]  fluticasone  (FLONASE ) 50 MCG/ACT nasal spray Place 1-2 sprays into both nostrils daily. 03/24/23     levocetirizine (XYZAL ) 5 MG tablet Take 1 tablet (5 mg total) by mouth every evening. 09/27/23     levocetirizine (XYZAL ) 5 MG tablet Take 1 tablet (5 mg total) by mouth every evening. 05/02/24     montelukast  (SINGULAIR ) 10 MG tablet Take 1 tablet (10 mg total) by mouth daily. 06/26/24     omeprazole  (PRILOSEC) 40 MG capsule Take 1 capsule (40 mg total) by mouth daily 1/2 hour to 1 hour before morning meal. 06/26/24     predniSONE  (STERAPRED UNI-PAK 21 TAB) 10 MG (21) TBPK tablet Take as directed 04/19/24   Moishe Chiquita HERO, NP  promethazine -dextromethorphan (PROMETHAZINE -DM) 6.25-15 MG/5ML syrup Take 5 mLs by mouth 4 (four) times daily as needed for cough. 04/19/24   Moishe Chiquita HERO, NP    Family History History reviewed. No  pertinent family history.  Social History Social History[1]   Allergies   Patient has no known allergies.   Review of Systems Review of Systems  Genitourinary:  Positive for testicular pain.     Physical Exam Triage Vital Signs ED Triage Vitals  Encounter Vitals Group     BP 12/20/24 1015 121/87     Girls Systolic BP Percentile --      Girls Diastolic BP Percentile --      Boys Systolic BP Percentile --      Boys Diastolic BP Percentile --      Pulse Rate 12/20/24 1015 84     Resp 12/20/24 1015 16     Temp 12/20/24 1015 98.3 F (36.8 C)     Temp Source 12/20/24 1015 Oral     SpO2 12/20/24 1015 97 %     Weight 12/20/24 1014 159 lb (72.1 kg)      Height --      Head Circumference --      Peak Flow --      Pain Score 12/20/24 1013 6     Pain Loc --      Pain Education --      Exclude from Growth Chart --    No data found.  Updated Vital Signs BP 121/87 (BP Location: Left Arm)   Pulse 84   Temp 98.3 F (36.8 C) (Oral)   Resp 16   Wt 159 lb (72.1 kg)   SpO2 97%   BMI 25.66 kg/m   Visual Acuity Right Eye Distance:   Left Eye Distance:   Bilateral Distance:    Right Eye Near:   Left Eye Near:    Bilateral Near:     Physical Exam Vitals and nursing note reviewed.  Constitutional:      General: He is not in acute distress.    Appearance: Normal appearance. He is not ill-appearing, toxic-appearing or diaphoretic.  HENT:     Mouth/Throat:     Mouth: Mucous membranes are moist.     Pharynx: Oropharynx is clear. No oropharyngeal exudate or posterior oropharyngeal erythema.  Eyes:     General: No scleral icterus. Cardiovascular:     Rate and Rhythm: Normal rate and regular rhythm.     Heart sounds: Normal heart sounds.  Pulmonary:     Effort: Pulmonary effort is normal. No respiratory distress.     Breath sounds: Normal breath sounds. No wheezing or rhonchi.  Abdominal:     General: Abdomen is flat. Bowel sounds are normal.     Palpations: Abdomen is soft.     Tenderness: There is no abdominal tenderness. There is no right CVA tenderness or left CVA tenderness.  Genitourinary:    Epididymis:     Right: Tenderness present.     Left: Tenderness present.  Skin:    General: Skin is warm.  Neurological:     Mental Status: He is alert and oriented to person, place, and time.  Psychiatric:        Mood and Affect: Mood normal.        Behavior: Behavior normal.      UC Treatments / Results  Labs (all labs ordered are listed, but only abnormal results are displayed) Labs Reviewed  POCT URINE DIPSTICK - Abnormal; Notable for the following components:      Result Value   Clarity, UA hazy (*)    All other  components within normal limits  CYTOLOGY, (ORAL, ANAL, URETHRAL) ANCILLARY ONLY  EKG   Radiology No results found.  Procedures Procedures (including critical Nguyen time)  Medications Ordered in UC Medications  cefTRIAXone (ROCEPHIN) injection 500 mg (500 mg Intramuscular Given 12/20/24 1059)    Initial Impression / Assessment and Plan / UC Course  I have reviewed the triage vital signs and the nursing notes.  Pertinent labs & imaging results that were available during my Nguyen of the patient were reviewed by me and considered in my medical decision making (see chart for details).     Final Clinical Impressions(s) / UC Diagnoses   Final diagnoses:  Epididymitis     Discharge Instructions      If symptoms do not improve we need to follow-up with urology    ED Prescriptions     Medication Sig Dispense Auth. Provider   doxycycline (VIBRAMYCIN) 100 MG capsule Take 1 capsule (100 mg total) by mouth 2 (two) times daily for 7 days. 14 capsule Andra Corean BROCKS, PA-C      PDMP not reviewed this encounter.    [1]  Social History Tobacco Use   Smoking status: Former    Current packs/day: 0.00    Types: Cigarettes    Quit date: 12/27/2011    Years since quitting: 12.9   Smokeless tobacco: Never  Vaping Use   Vaping status: Every Day   Substances: Nicotine, Flavoring  Substance Use Topics   Alcohol use: Yes    Comment: rarely   Drug use: No     Andra Corean BROCKS, PA-C 12/20/24 1938  "

## 2024-12-22 LAB — CYTOLOGY, (ORAL, ANAL, URETHRAL) ANCILLARY ONLY
Chlamydia: NEGATIVE
Comment: NEGATIVE
Comment: NEGATIVE
Comment: NORMAL
Neisseria Gonorrhea: NEGATIVE
Trichomonas: NEGATIVE

## 2024-12-27 ENCOUNTER — Other Ambulatory Visit (HOSPITAL_COMMUNITY): Payer: Self-pay

## 2024-12-27 MED ORDER — DOXYCYCLINE MONOHYDRATE 100 MG PO TABS
100.0000 mg | ORAL_TABLET | Freq: Two times a day (BID) | ORAL | 0 refills | Status: AC
  Filled 2024-12-27: qty 10, 5d supply, fill #0
